# Patient Record
Sex: Female | Born: 1970 | Hispanic: Yes | Marital: Single | Smoking: Never smoker
Health system: Southern US, Community
[De-identification: ages and names within clinical notes are randomized; demographics above are authoritative.]

## PROBLEM LIST (undated history)

## (undated) DIAGNOSIS — F419 Anxiety disorder, unspecified: Secondary | ICD-10-CM

## (undated) HISTORY — PX: TUBAL LIGATION: SHX77

## (undated) HISTORY — DX: Anxiety disorder, unspecified: F41.9

---

## 2002-08-06 ENCOUNTER — Emergency Department (HOSPITAL_COMMUNITY): Admission: EM | Admit: 2002-08-06 | Discharge: 2002-08-06 | Payer: Self-pay | Admitting: Emergency Medicine

## 2002-08-06 ENCOUNTER — Encounter: Payer: Self-pay | Admitting: Emergency Medicine

## 2003-08-24 ENCOUNTER — Emergency Department (HOSPITAL_COMMUNITY): Admission: EM | Admit: 2003-08-24 | Discharge: 2003-08-24 | Payer: Self-pay | Admitting: Emergency Medicine

## 2009-05-15 ENCOUNTER — Encounter: Admission: RE | Admit: 2009-05-15 | Discharge: 2009-05-15 | Payer: Self-pay | Admitting: Internal Medicine

## 2012-03-02 ENCOUNTER — Ambulatory Visit: Payer: Self-pay | Admitting: Gynecology

## 2012-03-05 ENCOUNTER — Ambulatory Visit: Payer: Self-pay | Admitting: Gynecology

## 2012-03-11 ENCOUNTER — Other Ambulatory Visit: Payer: Self-pay | Admitting: Gynecology

## 2012-03-11 ENCOUNTER — Ambulatory Visit (INDEPENDENT_AMBULATORY_CARE_PROVIDER_SITE_OTHER): Payer: BC Managed Care – PPO

## 2012-03-11 ENCOUNTER — Ambulatory Visit (INDEPENDENT_AMBULATORY_CARE_PROVIDER_SITE_OTHER): Payer: BC Managed Care – PPO | Admitting: Gynecology

## 2012-03-11 ENCOUNTER — Encounter: Payer: Self-pay | Admitting: Gynecology

## 2012-03-11 VITALS — BP 138/90 | Ht 64.0 in | Wt 166.0 lb

## 2012-03-11 DIAGNOSIS — D251 Intramural leiomyoma of uterus: Secondary | ICD-10-CM

## 2012-03-11 DIAGNOSIS — R3915 Urgency of urination: Secondary | ICD-10-CM

## 2012-03-11 DIAGNOSIS — R102 Pelvic and perineal pain: Secondary | ICD-10-CM

## 2012-03-11 DIAGNOSIS — D259 Leiomyoma of uterus, unspecified: Secondary | ICD-10-CM

## 2012-03-11 DIAGNOSIS — N949 Unspecified condition associated with female genital organs and menstrual cycle: Secondary | ICD-10-CM

## 2012-03-11 DIAGNOSIS — N852 Hypertrophy of uterus: Secondary | ICD-10-CM

## 2012-03-11 DIAGNOSIS — I10 Essential (primary) hypertension: Secondary | ICD-10-CM | POA: Insufficient documentation

## 2012-03-11 DIAGNOSIS — N831 Corpus luteum cyst of ovary, unspecified side: Secondary | ICD-10-CM

## 2012-03-11 DIAGNOSIS — N839 Noninflammatory disorder of ovary, fallopian tube and broad ligament, unspecified: Secondary | ICD-10-CM

## 2012-03-11 DIAGNOSIS — N83209 Unspecified ovarian cyst, unspecified side: Secondary | ICD-10-CM

## 2012-03-11 MED ORDER — IBUPROFEN 800 MG PO TABS: 800.0000 mg | ORAL_TABLET | Freq: Three times a day (TID) | ORAL | Status: AC | PRN

## 2012-03-11 MED ORDER — MEDROXYPROGESTERONE ACETATE 150 MG/ML IM SUSP
150.0000 mg | Freq: Once | INTRAMUSCULAR | Status: AC
  Administered 2012-03-11: 150 mg via INTRAMUSCULAR

## 2012-03-11 NOTE — Patient Instructions (Signed)
Quiste ovrico (Ovarian Cyst) Los ovarios son pequeos rganos que se encuentran a cada lado del tero. Los ovarios son los rganos que producen las hormonas femeninas, estrgeno y Education officer, museum. Un quiste en el ovario es una bolsa llena de lquido que puede variar en tamao. Es normal que se formen pequeos quistes en las mujeres en edad de procrear y que an tienen sus perodos Designer, jewellery. Este tipo de quiste se denomina quiste folicular que se transforma en un quiste ovulatorio (quiste del cuerpo lteo) despus de producir los vulos. Si la mujer no queda embarazada, desaparece sin ninguna intervencin. Existen otros tipos de quistes de ovario que pueden causar problemas y necesitan ser tratados. El problema ms grave es que el quiste sea canceroso. Debe advertirse que en las mujeres menopusicas que presentan un quiste de ovario, existe un mayor riesgo de que ese quiste sea canceroso. Deben evaluarse muy rpida y [place], y IT sales professional. Esto es ms importante en las mujeres menopusicas debido al elevado porcentaje de cncer de ovario durante este perodo. CAUSAS Y TIPOS DE CNCER DE OVARIO:  QUISTE FUNCIONAL: El quiste de folculo o cuerpo lteo es un quiste funcional que aparece todos los meses durante la ovulacin, con el ciclo menstrual. Si la mujer no queda embarazada, desaparecen con el prximo ciclo menstrual. Generalmente los quistes funcionales no presentan sntomas.   ENDOMETRIOMA: este quiste aparece en la superficie del tejido del tero. Un quiste se forma en el interior o [employer]. Cada mes se desarrolla un poco ms debido a la sangre del perodo menstrual. Tambin se denomina "quiste de chocolate" debido a que est lleno de sangre que se vuelve color marrn. Este tipo de quiste causa dolor en la zona inferior del abdomen durante las relaciones sexuales y durante el perodo menstrual.   CISTADENOMA: Se desarrolla a partir de las clulas externas del ovario.  Generalmente no son cancerosos. Pueden llegar a ser de gran tamao y causar dolor en la zona baja del abdomen y [employer] [place] [employer] sexuales. Este tipo de quiste puede retorcerse e interrumpir el flujo de [place], lo que causa un dolor muy intenso. Tambin puede romperse y Horticulturist, commercial.   QUISTE DERMOIDE: generalmente este tipo de quiste aparece en ambos ovarios. Puede haber diferentes tipos de tejidos en el quiste. Por ejemplo tejidos de piel, dientes, pelos o cartlago. En general no dan sntomas, excepto que sean muy grandes. Los quistes dermoides rara vez son cancerosos.   OVARIO POLIQUSTICO: es una enfermedad rara relacionada con trastornos hormonales que produce muchos quistes pequeos en ambos ovarios. Estos quistes son similares a los quistes de folculo pero nunca producen vulos y se transforman en cuerpo lteo. Pueden causar aumento del [employer], infertilidad, acn, aumento del vello facial y corporal y falta de perodos menstruales o perodos anormales. Muchas mujeres que sufren este problema presentan diabetes tipo 2. La causa exacta de este problema es desconocida. Un ovario poliqustico rara vez es canceroso.   QUISTE OVRICO TECALUTESTICO Aparece cuando hay demasiada hormona (gonadotrofina corinica humana), la que sobreestimula al ovario para producir vulos. Se observan con frecuencia cuando el mdico estimula los ovarios para la fertilizacin in vitro (bebs de probeta).   QUISTE LUTENICO: Aparece durante el embarazo. En algunos casos raros, produce una obstruccin del canal de parto. Generalmente desaparece despus del parto.  SNTOMAS  Dolor o molestias en la pelvis.   Dolor durante las [employer].   Aumento de la inflamacin en el abdomen.   Perodos menstruales anormales.  Aumento del [employer] perodos [place].   Deja de menstruar y no est embarazada.  DIAGNSTICO El diagnstico puede realizarse durante:  Los exmenes plvicos anuales  o de rutina (frecuente).   Ecografas   Radiografas de la pelvis.   Tomografa computada   Resonancia magntica..   Anlisis de sangre.  TRATAMIENTO  El tratamiento slo consiste en que el mdico controle el quiste [place]uria, durante 2  3 meses. Muchos desaparecen espontnemente, especialmente los quistes funcionales.   Puede aspirarse (secarse) con Marella Bile larga observndolo en una ecografa, o por laparoscopa (insertando un tubo en la pelvis a travs de una pequea incisin).   El quiste puede extirparse con laparoscopa.   En algunos casos es necesario extirparlo a travs de una incisin en la zona inferior del abdomen.   El tratamiento hormonal se utiliza para Restaurant manager, fast food ciertos tipos de [place].   Las pldoras anticonceptivas pueden utilizarse para Restaurant manager, fast food otros tipos.  INSTRUCCIONES PARA EL CUIDADO DOMICILIARIO Siga las indicaciones del profesional con respecto a:  Medicamentos   Visitas de control para evaluar y Pharmacologist.   Puede ser necesario que tenga que volver o concertar una cita con otro profesional para descubrir la causa exacta del quiste, si su mdico no es Research scientist (physical sciences).   Realice un examen plvico y un [place] todos los aos, segn las indicaciones.   Informe al mdico si tubo un quiste de ovario en el pasado.  SOLICITE ATENCIN MDICA SI:  Los perodos se atrasan, son irregulares, le faltan o son dolorosos.   El dolor abdominal (en el vientre) o en la pelvis persisten.   El abdomen se agranda o se hincha.   Siente una opresin en la vejiga o tiene problemas para vaciarla completamente.   Tiene dolor durante las [employer].   Tiene la sensacin de hinchazn, presin o molestias en el abdomen.   Pierde peso sin razn aparente.   Siente un Engineer, maintenance (IT).   Est constipada.   Pierde el apetito.   Aparece acn.   Aumenta el vello facial y Personal assistant.   Lenora Boys de peso sin hacer modificaciones en su actividad  fsica y en su dieta habitual.   Sospecha que est embarazada.  SOLICITE ATENCIN MDICA DE INMEDIATO SI:  Siente dolor abdominal cada vez ms intenso.   Si tiene ganas de vomitar (nuseas).   Le sube repentinamente la fiebre.   Siente dolor abdominal al mover el intestino.   Sus perodos menstruales son ms abundantes que lo habitual.  Document Released: 05/28/2005 Document Revised: 08/07/2011 [facility] Patient Information 2012 [place], [place].  Fibromas (Fibroids) Le han diagnosticado un fibroma. Los fibromas son tumores (bultos) de tejido muscular blando que pueden aparecer en cualquiel lugar del organismo de [place]. Generalmente se desarrollan en el tero (matriz). El sntoma (problema) ms comn de los fibromas es la hemorragia. Con el tiempo puede causar anemia (poca cantidad de glbulos rojos). Otros sntomas incluyen sensaciones de opresin y dolor en la pelvis. El diagnstico (determinar cul es el problema) se realiza a travs del examen fsico. A veces se utilizan pruebas como el [place]. Este examen es de gran utilidad cuando los fibromas se localizan alrededor de los ovarios y tambin para Engineer, manufacturing tumores. TRATAMIENTO  La mayor parte de los fibromas no necesitan tratamiento mdico o quirrgico. A veces es necesario realizar una biopsia (tomar muestras de tejido) de las paredes del tero para Clinical cytogeneticist. Si se ha descartado cncer y slo hay una pequea cantidad de hemorragia, el problema debe  mantenerse bajo observacin.   Un tratamiento hormonal puede mejorar el problema.   Cuando se hace necesario implementar una ciruga, sta consistir en la extirpacin del fibroma. Despus de la extirpacin de fibromas, no ser posible tener un parto por va vaginal. Todo depende de la ubicacin y la extensin de la [place]. Cuando se produce Chartered loss adjuster en presencia de un fibroma, generalmente es normal.   El profesional que lo asiste ayudar a Chief Strategy Officer que tratamiento es  el mejor para usted.  INSTRUCCIONES PARA EL CUIDADO DOMICILIARIO  Utilice los medicamentos de venta libre o de prescripcin para Chief Technology Officer, el malestar o la [place], segn se lo indique el profesional que lo asiste. No utilice aspirina, ya que puede Metallurgist.   Si las menstruaciones (perodos) son muy abundantes, registre el nmero de apsitos o tampones que utiliza por mes. Lleve esta informacin al profesional que la asiste. Esto puede ayudar a Leisure centre manager tratamiento para usted.  SOLICITE ATENCIN MDICA DE INMEDIATO SI:  Siente dolor o calambres en la pelvis que no puede controlar con los medicamentos, o experimenta un repentino aumento del Engineer, mining.   La hemorragia plvica [employer] o durante el transcurso de las mismas.   Si se siente mareada o se desmaya.   Presenta dolor abdominal (en el vientre) que se hace cada vez ms intenso.  Document Released: 08/18/2005 Document Revised: 08/07/2011 [facility] Patient Information 2012 [place], [place].  Endometriosis (Endometriosis) La endometriosis es un trastorno que se produce cuando existen porciones de endometrio (el recubrimiento del tero) fuera de su ubicacin normal. Puede ocurrir en muchos lugares prximos al tero (matriz), pero generalmente se produce en los ovarios, en las trompas de [place], en la vagina (canal de parto) y en los intestinos que se encuentran cerca del tero. Debido a que el tero elimina (expulsa) su recubrimiento todos los meses (menstruacin), hay una Cabin crew en el que el tejido endometrial est ubicado. SNTOMAS Generalmente no se presentan sntomas (problemas); sin embargo, debido a que la sangre irrita los tejidos que normalmente no estn expuestos a ella, cuando se producen los sntomas, estos varan segn Immunologist al cual se haya desplazado el endometrio. Entre los sntomas se incluyen el dolor de espalda y el dolor abdominal (vientre). Los perodos pueden  ser ms abundantes y las relaciones sexuales dolorosas. Puede haber infertilidad. Usted podr sufrir todos estos sntomas al [employer] o no, o puede haber meses en los que no tenga ningn sntoma. Aunque los sntomas se producen principalmente durante las menstruaciones, tambin pueden aparecer en la mitad del ciclo y generalmente terminan con la menopausia. DIAGNSTICO El profesional que la asiste podr indicarle un examen de sangre y de [place] para descartar otros trastornos. Otra prueba frecuente en estos casos es el [place], un procedimiento indoloro que utiliza ondas de sonido para hacer una ecografa del tejido anormal que indica endometriosis. Si siente dolor al mover el intestino durante el perodo, el profesional que la asiste podr indicarle un enema de bario (radiografa de la parte inferior del intestino), para tratar de Contractor origen del dolor. A veces se confirma por medio de una laparoscopa. La laparoscopa es un procedimiento en el que el profesional observa el interior del abdomen con un laparoscopio (un pequeo telescopio con forma de lpiz). El profesional podr tomar una pequea muestra de tejido (biopsia) de los tejidos anormales para confirmar o Psychiatric nurse problema. Los tejidos se envan al laboratorio y un patlogo los observa bajo el  microscopio para dar un diagnstico. TRATAMIENTO Una vez que se realiza el diagnstico, puede tratarse con la destruccin del tejido endometrial desplazado, utilizando calor (diatermia), lser, escisin (corte), o por medios qumicos. Tambin puede tratarse con terapia hormonal. Cuando se utiliza la terapia hormonal, se eliminan las [place], por lo tanto se elimina la exposicin mensual a la sangre del tejido endometrial desplazado. Slo en los casos graves es necesario realizar una histerectoma con extirpacin de las trompas, el tero y los ovarios. INSTRUCCIONES PARA EL CUIDADO DOMICILIARIO  Utilice los medicamentos de venta libre  o de prescripcin para Chief Technology Officer, el malestar o la [place], segn se lo indique el profesional que lo asiste.   Evite las actividades que producen dolor, incluyendo las [employer].   No tome aspirinas ya que puede aumentar las hemorragias cuando no recibe terapia hormonal.   Consulte al profesional que la asiste si siente dolor o tiene problemas que no puede controlar con Scientist, research (medical).  SOLICITE ATENCIN MDICA DE INMEDIATO SI:  El dolor es intenso y no responde a Tourist information centre manager.   Comienza a sentir nuseas y vmitos o no puede [employer].   El dolor se localiza en la zona inferior derecha del abdomen (posible apendicitis).   Presenta hinchazn o aumento del dolor en el abdomen.   Tiene fiebre.   Observa sangre en la materia fecal.  EST SEGURO QUE:   Comprende las instrucciones para el alta mdica.   Controlar su enfermedad.   Solicitar atencin mdica de inmediato segn las indicaciones.  Document Released: 08/18/2005 Document Revised: 08/07/2011 [facility] Patient Information 2012 [place], [place].  Laparoscopa diagnstica (Diagnostic Laparoscopy) La laparoscopa es un procedimiento quirrgico relativamente simple, de uso habitual y breve (menos de una hora) que se lleva a cabo para diagnosticar y tratar enfermedades del abdomen. El laparoscopio (tubo delgado, que emite luz, del tamao de un lpiz y similar a un telescopio) se inserta en el abdomen a travs de una pequea incisin (corte realizado por un cirujano). A travs de este instrumento, el profesional podr observar [employer] rganos del interior del abdomen (vientre) y ver si hay algo anormal. La laparoscopa podr llevarse a cabo tanto en el hospital como en un consultorio. Podrn administrarle un sedante suave que lo ayudar a relajarse antes y durante el procedimiento. Una vez en la sala de operaciones, le administrarn una anestesia general (a menos que usted y el profesional elijan otro tipo  de anestesia). Despus de la laparoscopa, que generalmente dura menos de Georgianne Fick, ser [employer] sala de recuperacin durante algunas horas. Cuando regrese a Pensions consultant, la Arts administrator [employer] y [place]. RIESGOS Y COMPLICACIONES Comparados con los beneficios, los riesgos de la laparoscopia son relativamente pocos. El Animal nutritionist con usted los riesgos antes del procedimiento. Algunos problemas que pueden ocurrir luego de la intervencin son:  Infecciones.   Hemorragias.   Puede ocurrir que se lesionen otros rganos.   Efectos secundarios de Higher education careers adviser.  PROCEDIMIENTO Una vez que se encuentra anestesiado, el cirujano insufla el abdomen con un gas inofensivo (dixido de carbono) para Research officer, political party observacin de los rganos de la pelvis. El cirujano introduce el laparoscopio a travs de una pequea incisin en el ombligo o alrededor del mismo. Podr insertar otros instrumentos, como una sonda para mover los rganos o Education officer, environmental algn procedimiento a travs de otra pequea incisin.  En ocasiones se toma una biopsia (muestra de tejido) para un diagnstico ms preciso o para Arboriculturist  enfermedad. La biopsia consiste en tomar una pequea muestra de tejido durante la laparoscopia para enviarlo al patlogo (especialista en la observacin de clulas y muestras de tejido) y que lo examine en el microscopio para un diagnstico a nivel de los tejidos. DESPUES DEL PROCEDIMIENTO  Se libera el gas del abdomen.   Las incisiones se cierran con puntos (suturas). Debido a que las incisiones son pequeas (generalmente de menos de 1 cm) las molestias son mnimas luego del procedimiento. Es posible que sienta cierto Sales executive. Es consecuencia de Education officer, community del tubo mientras se encontraba dormido. Es posible que sienta algn dolor abdominal no muy intenso. Tambin podr sentir [employer] en las incisiones realizadas para insertar los instrumentos en el  abdomen.   El tiempo de recuperacin es reducido, siempre que no haya habido complicaciones.   Har reposo en la sala de recuperacin hasta que se encuentre estable y se sienta bien. Si no aparecen complicaciones, podr regresar a su casa.  AVERIGE LOS RESULTADOS DE SU ANLISIS Durante su visita no contar con todos los [employer]. En este caso, tenga otra entrevista con su mdico para conocerlos. No piense que el resultado es normal si no tiene noticias de su mdico o de la institucin mdica. Es Copy seguimiento de todos los [place]. INSTRUCCIONES PARA EL CUIDADO DOMICILIARIO  Tome todos los medicamentos tal como se le indic.   Utilice los medicamentos de venta libre o de prescripcin para Chief Technology Officer, Environmental health practitioner o la [place], segn se lo indique el profesional que lo asiste.   Reanude las actividades habituales cuando se le indique.   Es preferible que se duche y no tome baos de inmersin.   Reanude la actividad sexual luego de [place], o cuando lo autoricen.   No conduzca mientras se encuentre bajo los efectos de narcticos.  SOLICITE ATENCIN MDICA SI:  Siente un dolor abdominal inexplicable.   Siente dolor en los hombros, en la regin de los tirantes.   Si se siente aturdido o siente que se va a Artist.   Siente escalofros.   Usted o su nio tienen una temperatura oral de ms de 102 F (38.9 C).   Observa un drenaje purulento (similar al pus) que proviene de alguna de las heridas.   Usted o su hijo no puede realizar movimientos intestinales o evacuar gases.   Usted o su hijo sufren nuseas o vmitos.  EST SEGURO QUE:   Comprende las instrucciones para el alta mdica.   Controlar su enfermedad.   Solicitar atencin mdica de inmediato segn las indicaciones.  Document Released: 08/18/2005 Document Revised: 08/07/2011 [facility] Patient Information 2012 [place], [place].

## 2012-03-11 NOTE — Progress Notes (Signed)
Patient is a 41 year old gravida 3 para 2 AB 1 (normal spontaneous vaginal delivery) who presented to the office today with a two-month history of right lower quadrant pains. She states that it's occurring on a daily basis not related to any time of her menstrual cycle. She is having normal menstrual cycles every 28 days and last 5-7 days. Her primary physician has her on hypertensive medication and was recently on antibiotic for urinary tract infection. She states sometimes she has dyspareunia. She has no other GU or GI complaints. She does states that she douches vaginally with water and then or at least every day. She has had a previous tubal sterilization procedure. Her primary physician recently general her lab work and Pap smear and according to patient that were all normal. She did bring a note from his office and all labs were normal.  Exam: Abdomen: Soft tender on deep palpation in the right lower quadrant. Patient with positive obturator and psoas sign on the Right. No CVA Tenderness. Pelvic: Bartholin urethra Skene was within normal limits Vagina: No lesion or discharge Cervix: No lesions or discharge Uterus: Slightly anteverted nonenlarged Adnexa: Tenderness in right adnexa could not get it to feel of the ovary will await results of ultrasound. Left adnexa same. Rectal: Not done  Ultrasound: Uterus measured 10.6 x 5.8 x 5.1 cm with an endometrial stripe of 8.2 mm. Uterus is anteverted to several intramural myomas were noted the largest one measuring 17 x 17 mm. Right ovary had a thick wall follicle negative color flow measured 15 x 17 mm. Right ovary was located against the wall of the uterus. Left ovary thinwall cyst measuring 30 x 31 mm with reticular echo pattern negative color flow.  Assessment/plan: Hemorrhagic cyst versus endometrioma. Patient will receive Depo-Provera 150 mg IM today and will return back to the office in 3 months for followup ultrasound. I've given the patient  literature information in Spanish on laparoscopy as well as on endometriosis and ovarian cyst. Prescription for Motrin 800 mg to take 1 by mouth 3 times a day was provided. All questions were answered we'll follow accordingly.

## 2012-03-12 LAB — URINALYSIS W MICROSCOPIC + REFLEX CULTURE
Leukocytes, UA: NEGATIVE
Nitrite: NEGATIVE
Protein, ur: NEGATIVE mg/dL
Squamous Epithelial / LPF: NONE SEEN
Urobilinogen, UA: 0.2 mg/dL (ref 0.0–1.0)

## 2012-06-18 ENCOUNTER — Ambulatory Visit (INDEPENDENT_AMBULATORY_CARE_PROVIDER_SITE_OTHER): Payer: BC Managed Care – PPO | Admitting: Gynecology

## 2012-06-18 ENCOUNTER — Encounter: Payer: Self-pay | Admitting: Gynecology

## 2012-06-18 ENCOUNTER — Ambulatory Visit (INDEPENDENT_AMBULATORY_CARE_PROVIDER_SITE_OTHER): Payer: BC Managed Care – PPO

## 2012-06-18 VITALS — BP 128/86

## 2012-06-18 DIAGNOSIS — R102 Pelvic and perineal pain: Secondary | ICD-10-CM

## 2012-06-18 DIAGNOSIS — N938 Other specified abnormal uterine and vaginal bleeding: Secondary | ICD-10-CM

## 2012-06-18 DIAGNOSIS — D219 Benign neoplasm of connective and other soft tissue, unspecified: Secondary | ICD-10-CM

## 2012-06-18 DIAGNOSIS — N83209 Unspecified ovarian cyst, unspecified side: Secondary | ICD-10-CM

## 2012-06-18 DIAGNOSIS — D259 Leiomyoma of uterus, unspecified: Secondary | ICD-10-CM

## 2012-06-18 DIAGNOSIS — N949 Unspecified condition associated with female genital organs and menstrual cycle: Secondary | ICD-10-CM

## 2012-06-18 LAB — TSH: TSH: 0.718 u[IU]/mL (ref 0.350–4.500)

## 2012-06-18 NOTE — Progress Notes (Signed)
The patient is a 41 year old who was seen the office on July 11 gravida 3 para 2 AB 1 (normal spontaneous vaginal delivery) who presented complaining of a two-month history of right lower quadrant pains. She states that it was occurring on a daily basis not related to any time of her menstrual cycle. Patient has had a previous tubal sterilization procedure. She had no other GU or GI complaints. She had stated that she did vaginal douching with water at least every other day and was instructed not to do so. An ultrasound at last office visit demonstrated the following:  Ultrasound: Uterus measured 10.6 x 5.8 x 5.1 cm with an endometrial stripe of 8.2 mm. Uterus is anteverted to several intramural myomas were noted the largest one measuring 17 x 17 mm. Right ovary had a thick wall follicle negative color flow measured 15 x 17 mm. Right ovary was located against the wall of the uterus. Left ovary thinwall cyst measuring 30 x 31 mm with reticular echo pattern negative color flow.   The working diagnosis was a hemorrhagic cyst versus endometrioma and she received Depo-Provera 150 mg IM and was instructed to return today for followup ultrasound. She states that her symptom has resolved that she has been spotting for 3 weeks before the start of her period.  She underwent a sonohysterogram today with the following result: Uterus measured 9.7 x 6.2 x 5.2 cm with endometrial stripe of 3.1 mm. Patient with 5 small fibroids the largest one measuring 18 x 12 mm. Both ovaries appeared to be normal. Sonohysterogram no intracavitary defect.  Patient was counseled in a sterile fashion with a P[place]elle an endometrial biopsy was obtained and tissue was submitted for histological evaluation. A TSH and prolactin will be drawn today. Patient will maintain a menstrual calendar for the next 4 months and return to the office in February for her annual exam and we'll reassess at that point if any further intervention is needed. Will  notify her when the results of the pathology report become available.

## 2012-10-14 ENCOUNTER — Encounter: Payer: BC Managed Care – PPO | Admitting: Gynecology

## 2012-10-21 ENCOUNTER — Ambulatory Visit (INDEPENDENT_AMBULATORY_CARE_PROVIDER_SITE_OTHER): Payer: BC Managed Care – PPO | Admitting: Gynecology

## 2012-10-21 ENCOUNTER — Encounter: Payer: Self-pay | Admitting: Gynecology

## 2012-10-21 VITALS — BP 150/90 | Ht 64.5 in | Wt 182.0 lb

## 2012-10-21 DIAGNOSIS — D259 Leiomyoma of uterus, unspecified: Secondary | ICD-10-CM | POA: Insufficient documentation

## 2012-10-21 DIAGNOSIS — R51 Headache: Secondary | ICD-10-CM

## 2012-10-21 DIAGNOSIS — R635 Abnormal weight gain: Secondary | ICD-10-CM

## 2012-10-21 DIAGNOSIS — N951 Menopausal and female climacteric states: Secondary | ICD-10-CM

## 2012-10-21 DIAGNOSIS — Z01419 Encounter for gynecological examination (general) (routine) without abnormal findings: Secondary | ICD-10-CM

## 2012-10-21 DIAGNOSIS — N912 Amenorrhea, unspecified: Secondary | ICD-10-CM

## 2012-10-21 DIAGNOSIS — R232 Flushing: Secondary | ICD-10-CM | POA: Insufficient documentation

## 2012-10-21 LAB — PREGNANCY, URINE: Preg Test, Ur: NEGATIVE

## 2012-10-21 LAB — PROLACTIN: Prolactin: 16 ng/mL

## 2012-10-21 LAB — HEMOGLOBIN A1C
Hgb A1c MFr Bld: 5.6 % (ref <5.7)
Mean Plasma Glucose: 114 mg/dL (ref <117)

## 2012-10-21 LAB — THYROID PANEL WITH TSH
T3 Uptake: 31.1 % (ref 22.5–37.0)
T4, Total: 8.8 ug/dL (ref 5.0–12.5)

## 2012-10-21 LAB — FOLLICLE STIMULATING HORMONE: FSH: 5.2 m[IU]/mL

## 2012-10-21 NOTE — Progress Notes (Signed)
Mia Robinson 1971/04/08 [id number]   History:    42 y.o.  for annual gyn exam also stated she had not had a menstrual cycle since last year. She stated her last normal menstrual cycle was in November. She stated in November was very light in December almost hardly any bleeding. She denied any menses in January and thus far in February no bleeding whatsoever. Patient complaining of weight gain some headaches and some hot flashes but no nipple discharge. She has had a previous tubal sterilization procedure. Her primary physician Dr. Ludwig Clarks has been treating her for hypertension and doing her lab work. Patient with no prior history of abnormal Pap smears. Patient was seen in the office in October 2013 having complaint of right lower quadrant discomfort and underwent an ultrasound with the following findings:  Ultrasound: (July 2013)Uterus measured 10.6 x 5.8 x 5.1 cm with an endometrial stripe of 8.2 mm. Uterus is anteverted to several intramural myomas were noted the largest one measuring 17 x 17 mm. Right ovary had a thick wall follicle negative color flow measured 15 x 17 mm. Right ovary was located against the wall of the uterus. Left ovary thinwall cyst measuring 30 x 31 mm with reticular echo pattern negative color flow.   The working diagnosis was a hemorrhagic cyst versus endometrioma and she received Depo-Provera 150 mg IM and was instructed to return today for followup ultrasound. She states that her symptom has resolved that she has been spotting for 3 weeks before the start of her period  Her followup ultrasound in October 2013 She underwent a sonohysterogram today with the following result:  Uterus measured 9.7 x 6.2 x 5.2 cm with endometrial stripe of 3.1 mm. Patient with 5 small fibroids the largest one measuring 18 x 12 mm. Both ovaries appeared to be normal. Sonohysterogram no intracavitary defect   Past medical history,surgical history, family history and social history were  all reviewed and documented in the EPIC chart.  Gynecologic History Patient's last menstrual period was 07/21/2012. Contraception: tubal ligation Last Pap: 2013. Results were: normal Last mammogram:  Not indicated. Results were: not indicated  Obstetric History OB History   Grav Para Term Preterm Abortions TAB SAB Ect Mult Living   3 2 2  1  1   2      # Outc Date GA Lbr Len/2nd Wgt Sex Del Anes PTL Lv   1 TRM     F SVD  No Yes   2 SAB            3 TRM     F SVD  No Yes       ROS: A ROS was performed and pertinent positives and negatives are included in the history.  GENERAL: No fevers or chills. HEENT: No change in vision, no earache, sore throat or sinus congestion. NECK: No pain or stiffness. CARDIOVASCULAR: No chest pain or pressure. No palpitations. PULMONARY: No shortness of breath, cough or wheeze. GASTROINTESTINAL: No abdominal pain, nausea, vomiting or diarrhea, melena or bright red blood per rectum. GENITOURINARY: No urinary frequency, urgency, hesitancy or dysuria. MUSCULOSKELETAL: No joint or muscle pain, no back pain, no recent trauma. DERMATOLOGIC: No rash, no itching, no lesions. ENDOCRINE: No polyuria, polydipsia, no heat or cold intolerance. No recent change in weight. HEMATOLOGICAL: No anemia or easy bruising or bleeding. NEUROLOGIC: No headache, seizures, numbness, tingling or weakness. PSYCHIATRIC: No depression, no loss of interest in normal activity or change in sleep pattern.     Exam:  chaperone present  BP 150/90  Ht 5' 4.5" (1.638 m)  Wt 182 lb (82.555 kg)  BMI 30.77 kg/m2  LMP 07/21/2012  Body mass index is 30.77 kg/(m^2).  General appearance : Well developed well nourished female. No acute distress HEENT: Neck supple, trachea midline, no carotid bruits, no thyroidmegaly Lungs: Clear to auscultation, no rhonchi or wheezes, or rib retractions  Heart: Regular rate and rhythm, no murmurs or gallops Breast:Examined in sitting and supine position were  symmetrical in appearance, no palpable masses or tenderness,  no skin retraction, no nipple inversion, no nipple discharge, no skin discoloration, no axillary or supraclavicular lymphadenopathy Abdomen: no palpable masses or tenderness, no rebound or guarding Extremities: no edema or skin discoloration or tenderness  Pelvic:  Bartholin, Urethra, Skene Glands: Within normal limits             Vagina: No gross lesions or discharge  Cervix: No gross lesions or discharge  Uterus  anteverted, normal size, shape and consistency, non-tender and mobile  Adnexa  Without masses or tenderness  Anus and perineum  normal   Rectovaginal  normal sphincter tone without palpated masses or tenderness             Hemoccult not indicated   Tenderness in lower abdomen right greater than left but no rebound or guarding  Assessment/Plan:  42 y.o. female for annual exam with apparent oligomenorrhea and symptoms of hot flashes. We'll check an [facility] today along with a TSH hemoglobin A1c and prolactin level. We will check a urine pregnancy test. She'll return back next week to discuss the result. Followup ultrasound October 2013 (4 months ago) completely normal with exception of small fibroids. No Pap smear done today the new guidelines discussed. Patient will need a mammogram next year.    Ok Edwards MD, 4:19 PM 10/21/2012

## 2012-10-28 ENCOUNTER — Institutional Professional Consult (permissible substitution): Payer: BC Managed Care – PPO | Admitting: Gynecology

## 2012-11-03 ENCOUNTER — Telehealth: Payer: Self-pay | Admitting: *Deleted

## 2012-11-03 NOTE — Telephone Encounter (Signed)
Left message for pt to call.

## 2012-11-03 NOTE — Telephone Encounter (Signed)
Pt informed with the below note. 

## 2012-11-03 NOTE — Telephone Encounter (Signed)
Pt had a OV to discuss labs results on 10/28/12 but pt canceled appointment. She is requesting the information that you were going to tell her at OV regarding lab results. Please advise

## 2012-11-03 NOTE — Telephone Encounter (Signed)
Tell patient her recent lab results were normal. We were going to discuss treatment for her oligomenorrhea.

## 2013-01-05 ENCOUNTER — Telehealth: Payer: Self-pay | Admitting: *Deleted

## 2013-01-05 MED ORDER — IBUPROFEN 800 MG PO TABS: 800.0000 mg | ORAL_TABLET | Freq: Three times a day (TID) | ORAL | Status: DC | PRN

## 2013-01-05 NOTE — Telephone Encounter (Signed)
Pt is calling requesting refill on Ibuprofen 800 mg to help with fibroids. Please advise

## 2013-01-05 NOTE — Telephone Encounter (Signed)
rx sent

## 2013-01-05 NOTE — Telephone Encounter (Signed)
Motrin 800 mg TID prn pain # 30 (with foods) refil x 1

## 2013-09-01 HISTORY — PX: BREAST BIOPSY: SHX20

## 2013-10-28 ENCOUNTER — Encounter: Payer: Self-pay | Admitting: Gynecology

## 2013-11-25 ENCOUNTER — Encounter: Payer: Self-pay | Admitting: Gynecology

## 2013-11-25 ENCOUNTER — Other Ambulatory Visit (HOSPITAL_COMMUNITY)
Admission: RE | Admit: 2013-11-25 | Discharge: 2013-11-25 | Disposition: A | Discharge status: Home / Self Care | Payer: BC Managed Care – PPO | Source: Ambulatory Visit | Attending: Gynecology | Admitting: Gynecology

## 2013-11-25 ENCOUNTER — Ambulatory Visit (INDEPENDENT_AMBULATORY_CARE_PROVIDER_SITE_OTHER): Payer: BC Managed Care – PPO

## 2013-11-25 ENCOUNTER — Ambulatory Visit (INDEPENDENT_AMBULATORY_CARE_PROVIDER_SITE_OTHER): Payer: BC Managed Care – PPO | Admitting: Gynecology

## 2013-11-25 VITALS — BP 134/86 | Ht 65.0 in | Wt 175.0 lb

## 2013-11-25 DIAGNOSIS — Z1151 Encounter for screening for human papillomavirus (HPV): Secondary | ICD-10-CM | POA: Insufficient documentation

## 2013-11-25 DIAGNOSIS — Z01419 Encounter for gynecological examination (general) (routine) without abnormal findings: Secondary | ICD-10-CM

## 2013-11-25 DIAGNOSIS — D259 Leiomyoma of uterus, unspecified: Secondary | ICD-10-CM

## 2013-11-25 DIAGNOSIS — N920 Excessive and frequent menstruation with regular cycle: Secondary | ICD-10-CM

## 2013-11-25 DIAGNOSIS — N898 Other specified noninflammatory disorders of vagina: Secondary | ICD-10-CM

## 2013-11-25 LAB — WET PREP FOR TRICH, YEAST, CLUE
Clue Cells Wet Prep HPF POC: NONE SEEN
Trich, Wet Prep: NONE SEEN
YEAST WET PREP: NONE SEEN

## 2013-11-25 MED ORDER — TRANEXAMIC ACID 650 MG PO TABS: ORAL_TABLET | ORAL | Status: DC

## 2013-11-25 NOTE — Patient Instructions (Addendum)
Ecografa transvaginal (Transvaginal Ultrasound) La ecografa transvaginal es una ecografa plvica en la que se utiliza una probeta metlica que se coloca en la vagina, para observar los rganos femeninos. El ecgrafo enva ondas sonoras desde un transductor (sonda). Estas ondas sonoras chocan contra las estructuras del cuerpo (como un eco) y crean Proofreader. La imagen se observa en un monitor. Se denomina transvaginal debido a que la sonda se inserta dentro de la vagina. Puede haber una pequea molestia por la introduccin de la sonda. Esta prueba tambin puede realizarse [employer]. La ecografa endovaginal es otro nombre para la ecografa transvaginal. En una ecografa transabdominal, la sonda se coloca en la parte externa del abdomen. Este mtodo no ofrece imgenes tan buenas como la tcnica transvaginal. La ecogafa transvaginal se utiliza para observar alteraciones en el tracto genital femenino. Entre ellos se incluyen:  Problemas de infertilidad.  Malformaciones congnitas (defecto de nacimiento) del tero y los ovarios.  Tumores en el tero.  Hemorragias anormales.  Tumores y quistes de ovario.  Abscesos (tejidos inflamados y pus) en la pelvis.  Dolor abdominal o plvico sin causa aparente.  Infecciones plvicas. DURANTE EL EMBARAZO, SE UTILIZA PAR OBSERVAR:  Embarazos normales.  Un embarazo ectpico (embarazo fuera del tero).  Latidos cardacos fetales.  Anormalidades de la pelvis que no se observan bien con la ecografa transabdominal.  Sospecha de gemelos o embarazo mltiple.  Aborto inminente.  Problemas en el cuello del tero (cuello incompetente, no permanece cerrado para contener al beb).  Cuando se realiza una amniocentesis (se retira lquido de la bolsa [place], para ser [place]).  Al buscar anormalidades en el beb.  Para controlar el crecimiento, el desarrollo y la edad del feto.  Para medir la cantidad de lquido en el saco  amnitico.  Cuando se realiza una versin externa del beb (se lo mueve a Programmer, applications).  Evaluar al beb en embarazos de alto riesgo (perfil biofsico).  Si se sospecha el deceso del beb (muerte). En algunos casos, se utiliza un mtodo especial denominado ecografa con infusin salina, para una observacin ms precisa del tero. Se inyecta solucin salina (agua con sal) dentro del tero en pacientes no embarazadas para observar mejor su interior. Este mtodo no se Designer, fashion/clothing. La probeta tambin puede usarse para obtener biopsias de Educational psychologist, para drenar lquido de quistes de ovario y para Designer, jewellery un DIU (dispositivo intrauterino para el control de la natalidad) que no pueda [place]. PREPARACIN PARA LA PRUEBA La ecografa transvaginal se realiza con la vejiga vaca. La ecografa transabdominal se realiza con la vejiga llena. Podrn solicitarle que beba varios vasos de agua antes del examen. En algunos casos se realiza una ecografa transabdominal antes de la ecografa transvaginal para obervar los rganos del abdomen. PROCEDIMIENTO  Deber acostarse en una cama, con las rodillas dobladas y los pies en los estribos. La probeta se cubre con un condn. Dentro de la vagina y en la probeta se aplica un lubricante estril. El lubricante ayuda a transmitir las ondas sonoras y Fish farm manager la irritacin de la vagina. El mdico mover la sonda en el interior de la cavidad vaginal para escanear las estructuras plvicas. Un examen normal mostrar una pelvis normal y contenidos normales en su interior. Una prueba anormal mostrar anormalidades en la pelvis, la placenta o el beb. LAS CAUSAS DE UN RESULTADO ANORMAL PUEDEN SER:  Crecimientos o tumores en:  El tero.  Los ovarios.  La vagina.  Otras estructuras plvicas.  Crecimientos no cancerosos  en el tero y los ovarios.  El ovario se retuerce y se corta el suministro de [place]).  Las reas de  infeccin incluyen:  Enfermedad inflamatoria plvica.  Un absceso en la pelvis.  Ubicacin de un DIU. LOS PROBLEMAS QUE PUEDEN HALLARSE EN UNA MUJER EMBARAZADA SON:  Embarazo ectpico (embarazo fuera del tero).  Embarazos mltiples.  Dilatacin (apertura) precoz anormal del cuello del tero. Esto puede indicar un cuello incompetente y Biomedical scientist.  Aborto inminente.  Muerte fetal.  Los problemas con la placenta incluyen:  La placenta se ha desarrollado sobre la abertura del cuello del tero (placenta previa).  La placenta se ha separado anticipadamente en el tero (abrupcin placentaria).  La placenta se desarrolla en el msculo del tero (placenta acreta).  Tumores del Media planner, incluyendo la enfermedad trofoblstica gestacional. Se trata de un embarazo anormal en el que no hay feto. El tero se llena de quistes similares a uvas que en algunos casos son cancerosos.  Posicin incorrecta del feto (de nalgas, de vrtice).  Retraso del desarrollo fetal intrauterino (escaso desarrollo en el tero).  Anormalidades o infeccin fetal. RIESGOS Y COMPLICACIONES No hay riesgos conocidos para la ecografa. No se toman radiografas cuando se realiza una ecografa. Document Released: 12/04/2008 Document Revised: 11/10/2011 [facility] Patient Information 2014 [place], [place]. Fibromas (Fibroids) Los fibromas son bultos (tumores) que pueden Administrator del cuerpo de Musician. Estos tumores no son cancerosos. Pueden variar en tamao, peso y lugar en el que crecen. CUIDADOS EN EL HOGAR  No tome aspirina.  Anote el nmero de apsitos o tampones que [place] durante el perodo. Infrmelo a su mdico. Esto puede ayudar a determinar el mejor tratamiento para usted. SOLICITE AYUDA DE INMEDIATO SI:  Siente dolor en la zona inferior del vientre (abdomen) y no se alivia con analgsicos.  Tiene clicos que no se calman con medicamentos  Aumenta el sangrado entre perodos o  durante el mismo.  Sufre mareos o se desvanece (se desmaya).  El dolor en el vientre [place]. ASEGRESE DE QUE:  Comprende estas instrucciones.  Controlar su enfermedad.  Solicitar ayuda de inmediato si no mejora o empeora. Document Released: 12/03/2010 Document Revised: 11/10/2011 [facility] Patient Information 2014 [place], [place]. Tranexamic acid oral tablets Qu es este medicamento? El CIDO [employer] reduce o detiene el proceso mediante el cual los cogulos sanguneos se descomponen. Este medicamento se [place] para tratar los [employer]. Este medicamento puede ser utilizado para otros usos; si tiene alguna pregunta consulte con su proveedor de atencin mdica o con su farmacutico. MARCAS COMERCIALES DISPONIBLES: [place], Lysteda  Qu le debo informar a mi profesional de la salud antes de tomar este medicamento? Necesita saber si usted presenta alguno de los siguientes problemas o situaciones: -sangrado en el cerebro -problemas de coagulacin -enfermedad renal -problemas de visin  -una reaccin alrgica o inusual al cido tranexmico, a otros medicamentos, alimentos, colorantes o conservantes -si est embarazada o buscando quedar embarazada -si est amamantando a un beb Cmo debo utilizar este medicamento? Tome este medicamento por va oral con un vaso de agua. Siga las instrucciones de la etiqueta del [place]. No corte, triture ni mstique este medicamento. Puede tomarlo con o sin alimentos. Si le produce malestar estomacal, tmelo con alimentos. Tome sus dosis a intervalos regulares. No tome su medicamento con una frecuencia mayor a la indicada. No deje de tomarlo excepto si as lo indica su mdico. No tome este medicamento hasta que empieza su perodo. No lo tome durante  ms de 5 das en seguidos. No tome este medicamento mientras no tiene su perodo. Hable con su pediatra para informarse acerca del uso de este medicamento en nios. Aunque  este medicamento ha sido recetado a nias tan menores como de 12 aos de edad para condiciones selectivas, las precauciones se aplican. Sobredosis: Pngase en contacto inmediatamente con un centro toxicolgico o una sala de urgencia si usted cree que haya tomado demasiado medicamento. ATENCIN: [employer] es solo para usted. No comparta este medicamento con nadie. Qu sucede si me olvido de una dosis? Si olvida una dosis, tmela cuando se recuerde y tome la prxima dosis por lo menos 6 horas despus. No tome ms de 2 tabletas a la vez para compensar las dosis olvidadas. Qu puede interactuar con este medicamento? No tome esta medicina con ninguno de los siguientes medicamentos: -hormonas femeninas, como estrgenos o progestinas y pldoras, parches, anillos o inyecciones anticonceptivas  Esta medicina tambin puede Counselling psychologist con los siguientes medicamentos: -ciertos medicamentos usados para ayudar a que la sangre coagule o romper los cogulos de sangre -ciertos medicamentos usados para tratar la leucemia Puede ser que esta lista no menciona todas las posibles interacciones. Informe a su profesional de [employer]	[place] de [employer] productos a base de hierbas, medicamentos de [place] o suplementos nutritivos que est tomando. Si usted fuma, consume bebidas alcohlicas o si utiliza drogas ilegales, indqueselo tambin a su profesional de KB Home	[place]. Algunas sustancias pueden interactuar con su medicamento. A qu debo estar atento al usar Coca-Cola? Informe a su mdico o su profesional de la salud si sus sntomas no comienzan a mejorar o si empeoran. Informe a su mdico o su profesional de la salud si observa cualquier problema con los ojos mientras recibe Coca-Cola. Su mdico le referir a Optician, dispensing de los ojos que [employer] ojos. Qu efectos secundarios puedo tener al [employer] Corporation este medicamento? Efectos secundarios que debe informar a su mdico o a Barrister's clerk de la salud  tan pronto como sea posible: -Chief of Staff como erupcin cutnea, picazn o urticarias, hinchazn de la cara, labios o lengua -problemas respiratorios -cambios en la visin -dolor repentino o grave en el pecho, piernas, cabeza o ingle -cansancio o debilidad inusual  Efectos secundarios que, por lo general, no requieren atencin mdica (debe informarlos a su mdico o a su profesional de la salud si persisten o si son molestos): -dolor de espalda -dolor de cabeza -dolores musculares o articulares -problemas nasales o sinusales -dolor estomacal -cansancio Puede ser que esta lista no menciona todos los posibles efectos secundarios. Comunquese a su mdico por asesoramiento mdico Hum[employer] Inc. Usted puede informar los efectos secundarios a la FDA por telfono al 1-800-FDA-1088. Dnde debo guardar mi medicina? [place] fuera del alcance de los nios. Gurdela a [employer], entre 15 y 40 grados C (81 y 82 grados F). Deseche todo el medicamento que no haya utilizado, despus de la fecha de vencimiento. ATENCIN: Este folleto es un resumen. Puede ser que no cubra toda la posible informacin. Si usted tiene preguntas acerca de esta medicina, consulte con su mdico, su farmacutico o su profesional de Technical sales engineer.  2014, Elsevier/Gold Standard. (2012-06-16 15:07:37)

## 2013-11-25 NOTE — Progress Notes (Signed)
Mia Robinson 10/20/1970 [id number]   History:    43 y.o.  for annual gyn exam and thought she may have had a vaginal discharge. The way she describes this as she experience is slight fluid discharge before the start of her menses and she was reassured. Patient is in a monogamous relationship. Patient with prior history of fibroid uterus. Patient is having normal menstrual cycles although heavy for the first 2 or 3 days.Her primary physician Dr. Mellody Drown has been treating her for hypertension and doing her lab work. Patient with no prior history of abnormal Pap smears. Patient with prior tubal sterilization procedure. Patient has not had a baseline mammogram as of yet.  Patient's ultrasound in October 2013: Uterus measured 9.7 x 6.2 x 5.2 cm with endometrial stripe of 3.1 mm. Patient with 5 small fibroids the largest one measuring 18 x 12 mm. Both ovaries appeared to be normal. Sonohysterogram no intracavitary defect    Past medical history,surgical history, family history and social history were all reviewed and documented in the EPIC chart.  Gynecologic History Patient's last menstrual period was 10/31/2013. Contraception: tubal ligation Last Pap: 2014. Results were: normal Last mammogram: No prior study. Results were: No prior study  Obstetric History OB History  Gravida Para Term Preterm AB SAB TAB Ectopic Multiple Living  3 2 2  1 1    2     # Outcome Date GA Lbr Len/2nd Weight Sex Delivery Anes PTL Lv  3 TRM     F SVD  N Y  2 SAB           1 TRM     F SVD  N Y       ROS: A ROS was performed and pertinent positives and negatives are included in the history.  GENERAL: No fevers or chills. HEENT: No change in vision, no earache, sore throat or sinus congestion. NECK: No pain or stiffness. CARDIOVASCULAR: No chest pain or pressure. No palpitations. PULMONARY: No shortness of breath, cough or wheeze. GASTROINTESTINAL: No abdominal pain, nausea, vomiting or diarrhea,  melena or bright red blood per rectum. GENITOURINARY: No urinary frequency, urgency, hesitancy or dysuria. MUSCULOSKELETAL: No joint or muscle pain, no back pain, no recent trauma. DERMATOLOGIC: No rash, no itching, no lesions. ENDOCRINE: No polyuria, polydipsia, no heat or cold intolerance. No recent change in weight. HEMATOLOGICAL: No anemia or easy bruising or bleeding. NEUROLOGIC: No headache, seizures, numbness, tingling or weakness. PSYCHIATRIC: No depression, no loss of interest in normal activity or change in sleep pattern.     Exam: chaperone present  BP 134/86  Ht 5\' 5"  (1.651 m)  Wt 175 lb (79.379 kg)  BMI 29.12 kg/m2  LMP 10/31/2013  Body mass index is 29.12 kg/(m^2).  General appearance : Well developed well nourished female. No acute distress HEENT: Neck supple, trachea midline, no carotid bruits, no thyroidmegaly Lungs: Clear to auscultation, no rhonchi or wheezes, or rib retractions  Heart: Regular rate and rhythm, no murmurs or gallops Breast:Examined in sitting and supine position were symmetrical in appearance, no palpable masses or tenderness,  no skin retraction, no nipple inversion, no nipple discharge, no skin discoloration, no axillary or supraclavicular lymphadenopathy Abdomen: no palpable masses or tenderness, no rebound or guarding Extremities: no edema or skin discoloration or tenderness  Pelvic:  Bartholin, Urethra, Skene Glands: Within normal limits             Vagina: No gross lesions or discharge  Cervix: No gross lesions or  discharge  Uterus  8-1/2 week size slightly irregular, normal size, shape and consistency, non-tender and mobile  Adnexa  Without masses or tenderness  Anus and perineum  normal   Rectovaginal  normal sphincter tone without palpated masses or tenderness             Hemoccult not indicated   Ultrasound today demonstrated a uterus that measured 9.6 x 5.6 x 5.2 cm endometrial stripe 9.4 mm (patient on day 19 of her cycle). Patient with  5 fibroids the largest one measuring 13 x 9 mm. Ovaries are normal she had a small corpus luteum cyst on the right ovary measuring 24 x 17 mm. There was no free fluid in the cul-de-sac.  Assessment/Plan:  43 y.o. female for annual exam doing well with stable size of her fibroid uterus. For her menorrhagia she is going to be prescribed Lysteda 650 mg 2 tablets 3 times a day for 5 days during her menses. Her PCP will be drawn her blood work. Her wet prep was negative she was reassured. Pap smear was not done today in accordance to the new guidelines. I positioned to schedule her mammogram was provided. She was reminded to do her monthly breast exam. Also discussed importance of calcium vitamin D and regular exercise for osteoporosis prevention.  Note: This dictation was prepared with  Dragon/digital dictation along withSmart phrase technology. Any transcriptional errors that result from this process are unintentional.   Terrance Mass MD, 5:17 PM 11/25/2013

## 2014-03-08 ENCOUNTER — Other Ambulatory Visit: Payer: Self-pay

## 2014-03-08 DIAGNOSIS — Z1231 Encounter for screening mammogram for malignant neoplasm of breast: Secondary | ICD-10-CM

## 2014-03-13 ENCOUNTER — Ambulatory Visit
Admission: RE | Admit: 2014-03-13 | Discharge: 2014-03-13 | Disposition: A | Discharge status: Home / Self Care | Payer: BC Managed Care – PPO | Source: Ambulatory Visit

## 2014-03-13 ENCOUNTER — Encounter (INDEPENDENT_AMBULATORY_CARE_PROVIDER_SITE_OTHER): Payer: Self-pay

## 2014-03-13 DIAGNOSIS — Z1231 Encounter for screening mammogram for malignant neoplasm of breast: Secondary | ICD-10-CM

## 2014-03-14 ENCOUNTER — Other Ambulatory Visit: Payer: Self-pay | Admitting: Gynecology

## 2014-03-14 DIAGNOSIS — R928 Other abnormal and inconclusive findings on diagnostic imaging of breast: Secondary | ICD-10-CM

## 2014-03-24 ENCOUNTER — Other Ambulatory Visit: Payer: BC Managed Care – PPO

## 2014-03-30 ENCOUNTER — Ambulatory Visit
Admission: RE | Admit: 2014-03-30 | Discharge: 2014-03-30 | Disposition: A | Discharge status: Home / Self Care | Payer: BC Managed Care – PPO | Source: Ambulatory Visit | Attending: Gynecology | Admitting: Gynecology

## 2014-03-30 ENCOUNTER — Other Ambulatory Visit: Payer: Self-pay | Admitting: Gynecology

## 2014-03-30 DIAGNOSIS — R928 Other abnormal and inconclusive findings on diagnostic imaging of breast: Secondary | ICD-10-CM

## 2014-03-30 DIAGNOSIS — R921 Mammographic calcification found on diagnostic imaging of breast: Secondary | ICD-10-CM

## 2014-04-10 ENCOUNTER — Ambulatory Visit
Admission: RE | Admit: 2014-04-10 | Discharge: 2014-04-10 | Disposition: A | Discharge status: Home / Self Care | Payer: BC Managed Care – PPO | Source: Ambulatory Visit | Attending: Gynecology | Admitting: Gynecology

## 2014-04-10 DIAGNOSIS — R921 Mammographic calcification found on diagnostic imaging of breast: Secondary | ICD-10-CM

## 2014-07-03 ENCOUNTER — Encounter: Payer: Self-pay | Admitting: Gynecology

## 2015-03-31 ENCOUNTER — Encounter (HOSPITAL_COMMUNITY): Payer: Self-pay

## 2015-03-31 ENCOUNTER — Emergency Department (HOSPITAL_COMMUNITY)
Admission: EM | Admit: 2015-03-31 | Discharge: 2015-03-31 | Disposition: A | Discharge status: Home / Self Care | Payer: BC Managed Care – PPO | Attending: Emergency Medicine | Admitting: Emergency Medicine

## 2015-03-31 DIAGNOSIS — Y9389 Activity, other specified: Secondary | ICD-10-CM | POA: Diagnosis not present

## 2015-03-31 DIAGNOSIS — Z8659 Personal history of other mental and behavioral disorders: Secondary | ICD-10-CM | POA: Diagnosis not present

## 2015-03-31 DIAGNOSIS — I10 Essential (primary) hypertension: Secondary | ICD-10-CM | POA: Diagnosis not present

## 2015-03-31 DIAGNOSIS — R220 Localized swelling, mass and lump, head: Secondary | ICD-10-CM | POA: Insufficient documentation

## 2015-03-31 DIAGNOSIS — X58XXXA Exposure to other specified factors, initial encounter: Secondary | ICD-10-CM | POA: Insufficient documentation

## 2015-03-31 DIAGNOSIS — Z7982 Long term (current) use of aspirin: Secondary | ICD-10-CM | POA: Diagnosis not present

## 2015-03-31 DIAGNOSIS — T7840XA Allergy, unspecified, initial encounter: Secondary | ICD-10-CM | POA: Diagnosis present

## 2015-03-31 DIAGNOSIS — R51 Headache: Secondary | ICD-10-CM | POA: Diagnosis not present

## 2015-03-31 DIAGNOSIS — Y998 Other external cause status: Secondary | ICD-10-CM | POA: Diagnosis not present

## 2015-03-31 DIAGNOSIS — Z79899 Other long term (current) drug therapy: Secondary | ICD-10-CM | POA: Insufficient documentation

## 2015-03-31 DIAGNOSIS — R42 Dizziness and giddiness: Secondary | ICD-10-CM | POA: Insufficient documentation

## 2015-03-31 DIAGNOSIS — Y9289 Other specified places as the place of occurrence of the external cause: Secondary | ICD-10-CM | POA: Insufficient documentation

## 2015-03-31 MED ORDER — DEXAMETHASONE SODIUM PHOSPHATE 10 MG/ML IJ SOLN
10.0000 mg | Freq: Once | INTRAMUSCULAR | Status: AC
  Administered 2015-03-31: 10 mg via INTRAMUSCULAR
  Filled 2015-03-31: qty 1

## 2015-03-31 MED ORDER — PREDNISONE 20 MG PO TABS: ORAL_TABLET | ORAL | Status: DC

## 2015-03-31 MED ORDER — FAMOTIDINE 20 MG PO TABS: 20.0000 mg | ORAL_TABLET | Freq: Two times a day (BID) | ORAL | Status: DC

## 2015-03-31 NOTE — ED Notes (Signed)
Pt had a keratin treatment and now reports burning scalp and pain/swelling in face. nad noted.

## 2015-03-31 NOTE — ED Notes (Signed)
Pt reports treatment on Wed, itching and redness/swelling started Thursday.  Has been taking Benadryl but no relief.

## 2015-03-31 NOTE — ED Provider Notes (Signed)
CSN: [id number]     Arrival date & time 03/31/15  2222 History  This chart was scribed for Mia Moras, PA-C, working with Mia Chapel, MD by Steva Colder, ED Scribe. The patient was seen in room TR09C/TR09C at 10:41 PM.    Chief Complaint  Patient presents with  . Allergic Reaction      The history is provided by the patient. No language interpreter was used.    HPI Comments: Mia Robinson is a 44 y.o. female who presents to the Emergency Department complaining of allergic reaction onset 4 days ago. Pt had a keratin treatment done 5 days ago and she now has a burning sensation to her scalp with pain and swelling of her face. She has had an allergic reaction to the keratin treatment 1.5 years ago and last 1 week. Pt notes that this time she did the organic keratin thinking it would make a difference and she wouldn't have an allergic reaction. Pt notes that she washed her hair twice since the incident. She states that she is having associated symptoms of swelling/irritation around the neck and mild dizziness. She states that she has tried benadryl  And OTC sinus congestion medications with no relief for her symptoms. She denies throat closing sensation, SOB, abdominal pain, n/v, and any other symptoms.   Past Medical History  Diagnosis Date  . Anxiety   . Hypertension    Past Surgical History  Procedure Laterality Date  . Tubal ligation     Family History  Problem Relation Age of Onset  . Hypertension Father    History  Substance Use Topics  . Smoking status: Never Smoker   . Smokeless tobacco: Never Used  . Alcohol Use: No   OB History    Gravida Para Term Preterm AB TAB SAB Ectopic Multiple Living   3 2 2  1  1   2      Review of Systems  HENT:       No throat closing sensation. Scalp irritation  Respiratory: Negative for shortness of breath.   Gastrointestinal: Negative for nausea, vomiting and abdominal pain.  Neurological: Positive for dizziness.       Allergies  Review of patient's allergies indicates no known allergies.  Home Medications   Prior to Admission medications   Medication Sig Start Date End Date Taking? Authorizing Provider  Ascorbic Acid (VITAMIN C) 100 MG tablet Take 100 mg by mouth daily.    Historical Provider, MD  aspirin 81 MG tablet Take 81 mg by mouth daily.    Historical Provider, MD  cholecalciferol (VITAMIN D) 1000 UNITS tablet Take 1,000 Units by mouth daily.    Historical Provider, MD  famotidine (PEPCID) 20 MG tablet Take 1 tablet (20 mg total) by mouth 2 (two) times daily. 03/31/15   Mia Moras, PA-C  ibuprofen (ADVIL,MOTRIN) 800 MG tablet Take 1 tablet (800 mg total) by mouth every 8 (eight) hours as needed for pain. 01/05/13   Terrance Mass, MD  predniSONE (DELTASONE) 20 MG tablet 2 tabs po daily x 4 days 03/31/15   Mia Moras, PA-C  ramipril (ALTACE) 5 MG capsule Take 5 mg by mouth daily.    Historical Provider, MD  tranexamic acid (LYSTEDA) 650 MG TABS tablet Take two tablets three times a day for five days during menses 11/25/13   Terrance Mass, MD   BP 151/96 mmHg  Pulse 69  Temp(Src) 97.9 F (36.6 C) (Oral)  Resp 18  Ht 5\' 5"  (1.651 m)  Wt 148 lb (67.132 kg)  BMI 24.63 kg/m2  SpO2 99% Physical Exam  Constitutional: She is oriented to person, place, and time. She appears well-developed and well-nourished. No distress.  HENT:  Head: Normocephalic and atraumatic.  Scalp with significant localized skin erythema throughout her scalp. No evidence of alopecia. Posterior cervical LAD. Mild edema noted to the left side of face. No mucosal involvement.   Eyes: EOM are normal.  Neck: Neck supple. No tracheal deviation present.  Cardiovascular: Normal rate.   Pulmonary/Chest: Effort normal. No respiratory distress.  Abdominal: Soft. Bowel sounds are normal. There is no tenderness.  No evidence of hives throughout the body  Musculoskeletal: Normal range of motion.  Lymphadenopathy:    She has  cervical adenopathy.       Right cervical: Posterior cervical adenopathy present.       Left cervical: Posterior cervical adenopathy present.  Neurological: She is alert and oriented to person, place, and time.  Skin: Skin is warm and dry.  Psychiatric: She has a normal mood and affect. Her behavior is normal.  Nursing note and vitals reviewed.   ED Course  Procedures (including critical care time) DIAGNOSTIC STUDIES: Oxygen Saturation is 99% on RA, nl by my interpretation.    COORDINATION OF CARE: 10:47 PM-Discussed treatment plan which includes decadron injection and prednisone Rx, continue taking benadryl, Pepcid Rx, and f/u if the symptoms worsen with pt at bedside and pt agreed to plan.   Labs Review Labs Reviewed - No data to display  Imaging Review No results found.   EKG Interpretation None      MDM   Final diagnoses:  Allergic reaction, initial encounter    BP 151/96 mmHg  Pulse 69  Temp(Src) 97.9 F (36.6 C) (Oral)  Resp 18  Ht 5\' 5"  (1.651 m)  Wt 148 lb (67.132 kg)  BMI 24.63 kg/m2  SpO2 99%  I personally performed the services described in this documentation, which was scribed in my presence. The recorded information has been reviewed and is accurate.     Mia Moras, PA-C 03/31/15 [place], MD 04/01/15 [phone]

## 2015-03-31 NOTE — Discharge Instructions (Signed)
Alergias (Allergies) El profesional que lo asiste le ha diagnosticado que usted padece de [place]. Las [employer] pueden ser ocasionadas por cualquier cosa a la que su organismo es sensible. Pueden ser alimentos, medicamentos, polen, sustancias qumicas y casi cualquiera de las cosas que lo rodean en su vida diaria que producen alrgenos. Un alrgeno es todo lo que hace que una sustancia produzca alergia. La herencia es uno de los factores que causa este problema. Esto significa que usted puede sufrir alguna de las alergias que sufrieron sus [place]. Las [employer] a la comida pueden ocurrir a Hotel manager. Estn entre las ms graves y Haematologist en peligro la vida. Algunos de los alimentos que comnmente producen [place] son la [place], los frutos de mar, los [place], los frutos secos, el trigo y la soja. SNTOMAS  Hinchazn alrededor de la boca.  Una erupcin roja que produce picazn o urticaria.  Vmitos o diarrea.  Dificultad para respirar. LAS REACCIONES ALRGICAS GRAVES PONEN EN PELIGRO LA VIDA . Esta reaccin se denomina anafilaxis. Puede ocasionar que la boca y la garganta se hinchen y produzca dificultad para respirar y Chartered loss adjuster. En reacciones graves, slo una pequea cantidad del alimento (por ejemplo, aceite de cacahuate en la ensalada) puede producir la muerte en pocos segundos. Las [employer] pueden ocurrir a Hotel manager. Se denominan as porque generalmente se producen durante la misma estacin todos los aos. Puede ser Ardelia Mems reaccin al moho, al polen del csped o al polen de los rboles. Otras causas del problema son los alrgenos que contienen los caros del polvo del hogar, el pelaje de las mascotas y las esporas del moho. Los sntomas consisten en congestin nasal, picazn y secrecin nasal asociada con estornudos, y lagrimeo y [employer] ojos. Tambin puede haber picazn de la boca y los odos. Estos problemas aparecen cuando se entra en contacto con el polen  y otros alrgenos. Los alrgenos son las partculas que estn en el aire y a las que el organismo reacciona cuando existe una Risk analyst. Esto hace que usted libere anticuerpos alrgicos. A travs de una cadena de eventos, estos finalmente hacen que usted libere histamina en la corriente sangunea. Aunque esto implica una proteccin para su organismo, es lo que le produce disconfort. Ese es el motivo por el que se le han indicado antihistamnicos para sentirse mejor. Si usted no Lexicographer cul es el alrgeno que le produjo la reaccin, puede someterse a una prueba de [place] o de piel. Las alergias no pueden curarse pero pueden controlarse con medicamentos. La fiebre de heno es un grupo de trastornos alrgicos estacionales Simplemente se tratan con medicamentos de venta libre como difenhidramina (Benadryl). Tome los medicamentos segn las indicaciones. No consuma alcohol ni conduzca mientras toma este medicamento. Consulte con el profesional que lo asiste o siga las instrucciones de uso para las dosis para nios. Si estos medicamentos no le Training and development officer, existen muchos otros nuevos que el profesional que lo asiste puede prescribirle. Podrn utilizarse medicamentos ms fuertes tales como un spray nasal, colirios y corticoides si los primeros medicamentos que prueba no lo [place]. Si todos estos fracasan, puede Risk manager otros tratamientos como la inmunoterapia o las inyecciones desensibilizantes. Haga una consulta de seguimiento con el profesional que lo asiste si los problemas continan. Estas alergias estacionales no ponen en peligro la vida. Generalmente se trata de una incomodidad que puede aliviarse con medicamentos. INSTRUCCIONES PARA EL CUIDADO DOMICILIARIO  Si no est seguro de que es lo [place]  produce la reaccin, lleve un registro de los alimentos que come y los sntomas que le siguen. Evite los alimentos que le producen reacciones.  Si presenta urticaria o una erupcin  cutnea:  Tome los medicamentos como se le indic.  Puede utilizar un antihistamnico de venta libre (difenhidramina) para la urticaria y la picazn, segn sea necesario.  Aplquese compresas sobre la piel o tome baos de agua fra. Evite los baos o las duchas calientes. El calor puede hacer que la urticaria y la picazn empeoren.  Si usted es muy alrgico:  Como consecuencia de un tratamiento para una reaccin grave, puede necesitar ser hospitalizado para recibir un seguimiento intensivo.  Utilice un brazalete o collar de alerta mdico, indicando que usted es alrgico.  Usted y su familia deben aprender a administrar adrenalina o a utilizar un kit anafilctico.  Si usted ya ha sufrido una reaccin grave, siempre lleve el kit anafilctico o el EpiPen con usted. Si sufre una reaccin grave, utilice esta medicacin del modo en que se lo indic el profesional que lo asiste. Una falla puede conllevar consecuencias fatales. SOLICITE ATENCIN MDICA SI:  Sospecha que puede sufrir una alergia a algn alimento. Los sntomas generalmente ocurren dentro de los 30 minutos posteriores a haber ingerido el alimento.  Los sntomas persistieron durante 2 das o han empeorado.  Desarrolla nuevos sntomas.  Quiere volver a probar o que su hijo consuma nuevamente un alimento o bebida que usted cree que le causa una reaccin alrgica. Nunca lo haga si ha sufrido una reaccin anafilctica a ese alimento o a esa bebida con anterioridad. Slo intntelo bajo la supervisin del mdico. SOLICITE ATENCIN MDICA DE INMEDIATO SI:  Presenta dificultad para respirar, jadea o tiene una sensacin de opresin en el pecho o en la garganta.  Tiene la boca hinchada, o presenta urticaria, hinchazn o picazn en todo el cuerpo.  Ha sufrido una reaccin grave que ha respondido a medicamentos como el kit anafilctico o al EpiPen. Estas reacciones pueden volver a presentarse cuando haya terminado la medicacin. Estas  reacciones deben considerarse como que ponen en peligro la vida. EST SEGURO QUE:   Comprende las instrucciones para el alta mdica.  Controlar su enfermedad.  Solicitar atencin mdica de inmediato segn las indicaciones. Document Released: 08/18/2005 Document Revised: 12/13/2012 ExitCare Patient Information 2015 [employer]. This information is not intended to replace advice given to you by your health care provider. Make sure you discuss any questions you have with your health care provider.  

## 2015-10-23 ENCOUNTER — Other Ambulatory Visit: Payer: Self-pay | Admitting: Internal Medicine

## 2015-10-23 DIAGNOSIS — N63 Unspecified lump in unspecified breast: Secondary | ICD-10-CM

## 2015-10-29 ENCOUNTER — Other Ambulatory Visit: Payer: BC Managed Care – PPO

## 2015-10-29 ENCOUNTER — Inpatient Hospital Stay: Admission: RE | Admit: 2015-10-29 | Payer: BC Managed Care – PPO | Source: Ambulatory Visit

## 2015-11-16 ENCOUNTER — Other Ambulatory Visit: Payer: Self-pay | Admitting: Internal Medicine

## 2015-11-16 ENCOUNTER — Ambulatory Visit
Admission: RE | Admit: 2015-11-16 | Discharge: 2015-11-16 | Disposition: A | Discharge status: Home / Self Care | Payer: BC Managed Care – PPO | Source: Ambulatory Visit | Attending: Internal Medicine | Admitting: Internal Medicine

## 2015-11-16 DIAGNOSIS — N6310 Unspecified lump in the right breast, unspecified quadrant: Secondary | ICD-10-CM

## 2015-11-16 DIAGNOSIS — N63 Unspecified lump in unspecified breast: Secondary | ICD-10-CM

## 2017-01-14 ENCOUNTER — Encounter: Payer: Self-pay | Admitting: Gynecology

## 2017-09-15 ENCOUNTER — Encounter: Payer: Self-pay | Admitting: Family Medicine

## 2017-09-16 LAB — LIPID PANEL
HDL: 65 (ref 35–70)
LDL Cholesterol: 119
Triglycerides: 49 (ref 40–160)

## 2017-11-20 ENCOUNTER — Other Ambulatory Visit: Payer: Self-pay | Admitting: Internal Medicine

## 2017-11-20 DIAGNOSIS — D241 Benign neoplasm of right breast: Secondary | ICD-10-CM

## 2017-12-22 ENCOUNTER — Ambulatory Visit
Admission: RE | Admit: 2017-12-22 | Discharge: 2017-12-22 | Disposition: A | Discharge status: Home / Self Care | Payer: BC Managed Care – PPO | Source: Ambulatory Visit | Attending: Internal Medicine | Admitting: Internal Medicine

## 2017-12-22 ENCOUNTER — Other Ambulatory Visit: Payer: Self-pay | Admitting: Internal Medicine

## 2017-12-22 DIAGNOSIS — D241 Benign neoplasm of right breast: Secondary | ICD-10-CM

## 2017-12-22 DIAGNOSIS — N63 Unspecified lump in unspecified breast: Secondary | ICD-10-CM

## 2017-12-23 ENCOUNTER — Ambulatory Visit
Admission: RE | Admit: 2017-12-23 | Discharge: 2017-12-23 | Disposition: A | Discharge status: Home / Self Care | Payer: BC Managed Care – PPO | Source: Ambulatory Visit | Attending: Internal Medicine | Admitting: Internal Medicine

## 2017-12-23 ENCOUNTER — Other Ambulatory Visit: Payer: Self-pay | Admitting: Internal Medicine

## 2017-12-23 DIAGNOSIS — D241 Benign neoplasm of right breast: Secondary | ICD-10-CM

## 2017-12-23 DIAGNOSIS — N6002 Solitary cyst of left breast: Secondary | ICD-10-CM

## 2017-12-25 LAB — BASIC METABOLIC PANEL
BUN: 12 (ref 4–21)
CO2: 26 — AB (ref 13–22)
Chloride: 104 (ref 99–108)
Creatinine: 0.5 (ref 0.5–1.1)
Glucose: 87
Potassium: 4.2 (ref 3.4–5.3)
Sodium: 140 (ref 137–147)

## 2017-12-25 LAB — HEPATIC FUNCTION PANEL
ALT: 10 (ref 7–35)
AST: 15 (ref 13–35)
Alkaline Phosphatase: 71 (ref 25–125)
Bilirubin, Total: 0.3

## 2017-12-25 LAB — COMPREHENSIVE METABOLIC PANEL
Albumin: 4.4 (ref 3.5–5.0)
Calcium: 9.4 (ref 8.7–10.7)

## 2018-02-09 ENCOUNTER — Encounter: Payer: Self-pay | Admitting: Women's Health

## 2018-02-09 ENCOUNTER — Ambulatory Visit (INDEPENDENT_AMBULATORY_CARE_PROVIDER_SITE_OTHER): Payer: BC Managed Care – PPO | Admitting: Women's Health

## 2018-02-09 VITALS — BP 126/84 | Ht 65.0 in | Wt 156.0 lb

## 2018-02-09 DIAGNOSIS — Z01419 Encounter for gynecological examination (general) (routine) without abnormal findings: Secondary | ICD-10-CM

## 2018-02-09 NOTE — Addendum Note (Signed)
Addended by: Lorine Bears on: 02/09/2018 12:26 PM   Modules accepted: Orders

## 2018-02-09 NOTE — [facility] Notes (Signed)
Mia Robinson 09/05/1970 [id number]    History:    Presents for annual exam.  Cycles becoming more irregular this past year last cycle last month.  Not sexually active in greater than 3 years, divorced, denies need for STD screen.  BTL.  Normal Pap and mammogram history, had a breast cyst aspirated April 2019.  2015- breast biopsy.  Primary care manages labs.  Past medical history, past surgical history, family history and social history were all reviewed and documented in the EPIC chart.  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  2 daughters ages 73 and 36 both doing well.  Originally from [place].  Mother healthy, father hypertension.  ROS:  A ROS was performed and pertinent positives and negatives are included.  Exam:  Vitals:   02/09/18 1039  BP: 126/84  Weight: 156 lb (70.8 kg)  Height: 5\' 5"  (1.651 m)   Body mass index is 25.96 kg/m.   General appearance:  Normal Thyroid:  Symmetrical, normal in size, without palpable masses or nodularity. Respiratory  Auscultation:  Clear without wheezing or rhonchi Cardiovascular  Auscultation:  Regular rate, without rubs, murmurs or gallops  Edema/varicosities:  Not grossly evident Abdominal  Soft,nontender, without masses, guarding or rebound.  Liver/spleen:  No organomegaly noted  Hernia:  None appreciated  Skin  Inspection:  Grossly normal   Breasts: Examined lying and sitting.     Right: Without masses, retractions, discharge or axillary adenopathy.     Left: Without masses, retractions, discharge or axillary adenopathy. Gentitourinary   Inguinal/mons:  Normal without inguinal adenopathy  External genitalia:  Normal  BUS/Urethra/Skene's glands:  Normal  Vagina:  Normal  Cervix:  Normal  Uterus:  normal in size, shape and contour.  Midline and mobile  Adnexa/parametria:     Rt: Without masses or tenderness.   Lt: Without masses or tenderness.  Anus and perineum: Normal  Digital rectal exam: Normal sphincter tone  without palpated masses or tenderness  Assessment/Plan:  47 y.o. DHF G3, P2 for annual exam no complaints.  Perimenopausal/ irregular cycles/BTL/not sexually active History of breast cysts Labs-primary care  Plan: SBE's, continue annual screening mammogram, calcium rich foods, vitamin D 1000 daily encouraged.  Encouraged to continue healthy lifestyle of regular exercise and healthy diet.  Denies need for contraception.  Pap with HR HPV typing, new screening guidelines reviewed.    [place], 11:09 AM 02/09/2018

## 2018-02-09 NOTE — Patient Instructions (Signed)
Vit D 1000 daily Health Maintenance, Female Adopting a healthy lifestyle and getting preventive care can go a long way to promote health and wellness. Talk with your health care provider about what schedule of regular examinations is right for you. This is a good chance for you to check in with your provider about disease prevention and staying healthy. In between checkups, there are plenty of things you can do on your own. Experts have done a lot of research about which lifestyle changes and preventive measures are most likely to keep you healthy. Ask your health care provider for more information. Weight and diet Eat a healthy diet  Be sure to include plenty of vegetables, fruits, low-fat dairy products, and lean protein.  Do not eat a lot of foods high in solid fats, added sugars, or salt.  Get regular exercise. This is one of the most important things you can do for your health. ? Most adults should exercise for at least 150 minutes each week. The exercise should increase your heart rate and make you sweat (moderate-intensity exercise). ? Most adults should also do strengthening exercises at least twice a week. This is in addition to the moderate-intensity exercise.  Maintain a healthy weight  Body mass index (BMI) is a measurement that can be used to identify possible weight problems. It estimates body fat based on height and weight. Your health care provider can help determine your BMI and help you achieve or maintain a healthy weight.  For females 55 years of age and older: ? A BMI below 18.5 is considered underweight. ? A BMI of 18.5 to 24.9 is normal. ? A BMI of 25 to 29.9 is considered overweight. ? A BMI of 30 and above is considered obese.  Watch levels of cholesterol and blood lipids  You should start having your blood tested for lipids and cholesterol at 45 years of age, then have this test every 5 years.  You may need to have your cholesterol levels checked more often  if: ? Your lipid or cholesterol levels are high. ? You are older than 45 years of age. ? You are at high risk for heart disease.  Cancer screening Lung Cancer  Lung cancer screening is recommended for adults 54-68 years old who are at high risk for lung cancer because of a history of smoking.  A yearly low-dose CT scan of the lungs is recommended for people who: ? Currently smoke. ? Have quit within the past 15 years. ? Have at least a 30-pack-year history of smoking. A pack year is smoking an average of one pack of cigarettes a day for 1 year.  Yearly screening should continue until it has been 15 years since you quit.  Yearly screening should stop if you develop a health problem that would prevent you from having lung cancer treatment.  Breast Cancer  Practice breast self-awareness. This means understanding how your breasts normally appear and feel.  It also means doing regular breast self-exams. Let your health care provider know about any changes, no matter how small.  If you are in your 20s or 30s, you should have a clinical breast exam (CBE) by a health care provider every 1-3 years as part of a regular health exam.  If you are 41 or older, have a CBE every year. Also consider having a breast X-ray (mammogram) every year.  If you have a family history of breast cancer, talk to your health care provider about genetic screening.  If you  are at high risk for breast cancer, talk to your health care provider about having an MRI and a mammogram every year.  Breast cancer gene (BRCA) assessment is recommended for women who have family members with BRCA-related cancers. BRCA-related cancers include: ? Breast. ? Ovarian. ? Tubal. ? Peritoneal cancers.  Results of the assessment will determine the need for genetic counseling and BRCA1 and BRCA2 testing.  Cervical Cancer Your health care provider may recommend that you be screened regularly for cancer of the pelvic organs  (ovaries, uterus, and vagina). This screening involves a pelvic examination, including checking for microscopic changes to the surface of your cervix (Pap test). You may be encouraged to have this screening done every 3 years, beginning at age 72.  For women ages 67-65, health care providers may recommend pelvic exams and Pap testing every 3 years, or they may recommend the Pap and pelvic exam, combined with testing for human papilloma virus (HPV), every 5 years. Some types of HPV increase your risk of cervical cancer. Testing for HPV may also be done on women of any age with unclear Pap test results.  Other health care providers may not recommend any screening for nonpregnant women who are considered low risk for pelvic cancer and who do not have symptoms. Ask your health care provider if a screening pelvic exam is right for you.  If you have had past treatment for cervical cancer or a condition that could lead to cancer, you need Pap tests and screening for cancer for at least 20 years after your treatment. If Pap tests have been discontinued, your risk factors (such as having a new sexual partner) need to be reassessed to determine if screening should resume. Some women have medical problems that increase the chance of getting cervical cancer. In these cases, your health care provider may recommend more frequent screening and Pap tests.  Colorectal Cancer  This type of cancer can be detected and often prevented.  Routine colorectal cancer screening usually begins at 45 years of age and continues through 45 years of age.  Your health care provider may recommend screening at an earlier age if you have risk factors for colon cancer.  Your health care provider may also recommend using home test kits to check for hidden blood in the stool.  A small camera at the end of a tube can be used to examine your colon directly (sigmoidoscopy or colonoscopy). This is done to check for the earliest forms of  colorectal cancer.  Routine screening usually begins at age 74.  Direct examination of the colon should be repeated every 5-10 years through 45 years of age. However, you may need to be screened more often if early forms of precancerous polyps or small growths are found.  Skin Cancer  Check your skin from head to toe regularly.  Tell your health care provider about any new moles or changes in moles, especially if there is a change in a mole's shape or color.  Also tell your health care provider if you have a mole that is larger than the size of a pencil eraser.  Always use sunscreen. Apply sunscreen liberally and repeatedly throughout the day.  Protect yourself by wearing long sleeves, pants, a wide-brimmed hat, and sunglasses whenever you are outside.  Heart disease, diabetes, and high blood pressure  High blood pressure causes heart disease and increases the risk of stroke. High blood pressure is more likely to develop in: ? People who have blood pressure in  the high end of the normal range (130-139/85-89 mm Hg). ? People who are overweight or obese. ? People who are African American.  If you are 18-39 years of age, have your blood pressure checked every 3-5 years. If you are 40 years of age or older, have your blood pressure checked every year. You should have your blood pressure measured twice-once when you are at a hospital or clinic, and once when you are not at a hospital or clinic. Record the average of the two measurements. To check your blood pressure when you are not at a hospital or clinic, you can use: ? An automated blood pressure machine at a pharmacy. ? A home blood pressure monitor.  If you are between 55 years and 79 years old, ask your health care provider if you should take aspirin to prevent strokes.  Have regular diabetes screenings. This involves taking a blood sample to check your fasting blood sugar level. ? If you are at a normal weight and have a low risk  for diabetes, have this test once every three years after 45 years of age. ? If you are overweight and have a high risk for diabetes, consider being tested at a younger age or more often. Preventing infection Hepatitis B  If you have a higher risk for hepatitis B, you should be screened for this virus. You are considered at high risk for hepatitis B if: ? You were born in a country where hepatitis B is common. Ask your health care provider which countries are considered high risk. ? Your parents were born in a high-risk country, and you have not been immunized against hepatitis B (hepatitis B vaccine). ? You have HIV or AIDS. ? You use needles to inject street drugs. ? You live with someone who has hepatitis B. ? You have had sex with someone who has hepatitis B. ? You get hemodialysis treatment. ? You take certain medicines for conditions, including cancer, organ transplantation, and autoimmune conditions.  Hepatitis C  Blood testing is recommended for: ? Everyone born from 1945 through 1965. ? Anyone with known risk factors for hepatitis C.  Sexually transmitted infections (STIs)  You should be screened for sexually transmitted infections (STIs) including gonorrhea and chlamydia if: ? You are sexually active and are younger than 45 years of age. ? You are older than 45 years of age and your health care provider tells you that you are at risk for this type of infection. ? Your sexual activity has changed since you were last screened and you are at an increased risk for chlamydia or gonorrhea. Ask your health care provider if you are at risk.  If you do not have HIV, but are at risk, it may be recommended that you take a prescription medicine daily to prevent HIV infection. This is called pre-exposure prophylaxis (PrEP). You are considered at risk if: ? You are sexually active and do not regularly use condoms or know the HIV status of your partner(s). ? You take drugs by  injection. ? You are sexually active with a partner who has HIV.  Talk with your health care provider about whether you are at high risk of being infected with HIV. If you choose to begin PrEP, you should first be tested for HIV. You should then be tested every 3 months for as long as you are taking PrEP. Pregnancy  If you are premenopausal and you may become pregnant, ask your health care provider about preconception counseling.    If you may become pregnant, take 400 to 800 micrograms (mcg) of folic acid every day.  If you want to prevent pregnancy, talk to your health care provider about birth control (contraception). Osteoporosis and menopause  Osteoporosis is a disease in which the bones lose minerals and strength with aging. This can result in serious bone fractures. Your risk for osteoporosis can be identified using a bone density scan.  If you are 17 years of age or older, or if you are at risk for osteoporosis and fractures, ask your health care provider if you should be screened.  Ask your health care provider whether you should take a calcium or vitamin D supplement to lower your risk for osteoporosis.  Menopause may have certain physical symptoms and risks.  Hormone replacement therapy may reduce some of these symptoms and risks. Talk to your health care provider about whether hormone replacement therapy is right for you. Follow these instructions at home:  Schedule regular health, dental, and eye exams.  Stay current with your immunizations.  Do not use any tobacco products including cigarettes, chewing tobacco, or electronic cigarettes.  If you are pregnant, do not drink alcohol.  If you are breastfeeding, limit how much and how often you drink alcohol.  Limit alcohol intake to no more than 1 drink per day for nonpregnant women. One drink equals 12 ounces of beer, 5 ounces of wine, or 1 ounces of hard liquor.  Do not use street drugs.  Do not share needles.  Ask  your health care provider for help if you need support or information about quitting drugs.  Tell your health care provider if you often feel depressed.  Tell your health care provider if you have ever been abused or do not feel safe at home. This information is not intended to replace advice given to you by your health care provider. Make sure you discuss any questions you have with your health care provider. Document Released: 03/03/2011 Document Revised: 01/24/2016 Document Reviewed: 05/22/2015 Elsevier Interactive Patient Education  [employer].

## 2018-02-10 LAB — PAP, TP IMAGING W/ HPV RNA, RFLX HPV TYPE 16,18/45: HPV DNA High Risk: NOT DETECTED

## 2018-06-25 ENCOUNTER — Ambulatory Visit
Admission: RE | Admit: 2018-06-25 | Discharge: 2018-06-25 | Disposition: A | Discharge status: Home / Self Care | Payer: BC Managed Care – PPO | Source: Ambulatory Visit | Attending: Internal Medicine | Admitting: Internal Medicine

## 2018-06-25 DIAGNOSIS — N6002 Solitary cyst of left breast: Secondary | ICD-10-CM

## 2019-03-23 ENCOUNTER — Other Ambulatory Visit: Payer: Self-pay

## 2019-03-23 DIAGNOSIS — Z20822 Contact with and (suspected) exposure to covid-19: Secondary | ICD-10-CM

## 2019-03-26 LAB — NOVEL CORONAVIRUS, NAA: SARS-CoV-2, NAA: NOT DETECTED

## 2019-03-31 ENCOUNTER — Telehealth: Payer: Self-pay

## 2019-03-31 NOTE — Telephone Encounter (Signed)
Pt. Called back, given COVID 19 results.

## 2019-08-02 ENCOUNTER — Other Ambulatory Visit: Payer: Self-pay | Admitting: Family Medicine

## 2019-08-02 ENCOUNTER — Other Ambulatory Visit: Payer: Self-pay | Admitting: Internal Medicine

## 2019-08-02 DIAGNOSIS — Z1231 Encounter for screening mammogram for malignant neoplasm of breast: Secondary | ICD-10-CM

## 2019-08-22 ENCOUNTER — Ambulatory Visit
Admission: RE | Admit: 2019-08-22 | Discharge: 2019-08-22 | Disposition: A | Discharge status: Home / Self Care | Payer: BC Managed Care – PPO | Source: Ambulatory Visit | Attending: Internal Medicine | Admitting: Internal Medicine

## 2019-08-22 ENCOUNTER — Other Ambulatory Visit: Payer: Self-pay

## 2019-08-22 DIAGNOSIS — Z1231 Encounter for screening mammogram for malignant neoplasm of breast: Secondary | ICD-10-CM

## 2019-09-12 ENCOUNTER — Encounter: Payer: Self-pay | Admitting: Family Medicine

## 2019-09-12 ENCOUNTER — Ambulatory Visit: Payer: BC Managed Care – PPO | Admitting: Family Medicine

## 2019-09-12 ENCOUNTER — Other Ambulatory Visit: Payer: Self-pay

## 2019-09-12 VITALS — BP 128/80 | HR 63 | Temp 96.4°F | Resp 12 | Ht 65.0 in | Wt 165.0 lb

## 2019-09-12 DIAGNOSIS — I10 Essential (primary) hypertension: Secondary | ICD-10-CM

## 2019-09-12 DIAGNOSIS — E538 Deficiency of other specified B group vitamins: Secondary | ICD-10-CM | POA: Insufficient documentation

## 2019-09-12 DIAGNOSIS — E559 Vitamin D deficiency, unspecified: Secondary | ICD-10-CM | POA: Insufficient documentation

## 2019-09-12 DIAGNOSIS — E785 Hyperlipidemia, unspecified: Secondary | ICD-10-CM | POA: Insufficient documentation

## 2019-09-12 DIAGNOSIS — Z131 Encounter for screening for diabetes mellitus: Secondary | ICD-10-CM

## 2019-09-12 LAB — VITAMIN D 25 HYDROXY (VIT D DEFICIENCY, FRACTURES): VITD: 56.45 ng/mL (ref 30.00–100.00)

## 2019-09-12 LAB — LIPID PANEL
Cholesterol: 189 mg/dL (ref 0–200)
HDL: 57 mg/dL (ref >39.00)
LDL Cholesterol: 117 mg/dL — ABNORMAL HIGH (ref 0–99)
NonHDL: 132.47
Total CHOL/HDL Ratio: 3
Triglycerides: 76 mg/dL (ref 0.0–149.0)
VLDL: 15.2 mg/dL (ref 0.0–40.0)

## 2019-09-12 LAB — CBC
HCT: 38.4 % (ref 36.0–46.0)
Hemoglobin: 12.9 g/dL (ref 12.0–15.0)
MCHC: 33.7 g/dL (ref 30.0–36.0)
MCV: 88.3 fl (ref 78.0–100.0)
Platelets: 261 10*3/uL (ref 150.0–400.0)
RBC: 4.35 Mil/uL (ref 3.87–5.11)
RDW: 13.2 % (ref 11.5–15.5)
WBC: 6.4 10*3/uL (ref 4.0–10.5)

## 2019-09-12 LAB — BASIC METABOLIC PANEL
BUN: 6 mg/dL (ref 6–23)
CO2: 32 mEq/L (ref 19–32)
Calcium: 10.2 mg/dL (ref 8.4–10.5)
Chloride: 101 mEq/L (ref 96–112)
Creatinine, Ser: 0.59 mg/dL (ref 0.40–1.20)
GFR: 109.64 mL/min (ref >60.00)
Glucose, Bld: 83 mg/dL (ref 70–99)
Potassium: 4.7 mEq/L (ref 3.5–5.1)
Sodium: 141 mEq/L (ref 135–145)

## 2019-09-12 LAB — HEMOGLOBIN A1C: Hgb A1c MFr Bld: 5.7 % (ref 4.6–6.5)

## 2019-09-12 LAB — VITAMIN B12: Vitamin B-12: >1500 pg/mL — ABNORMAL HIGH (ref 211–911)

## 2019-09-12 NOTE — Progress Notes (Signed)
HPI:   Mia Robinson is a 49 y.o. female, who is here today to establish care.  Former PCP: Dr Mellody Drown. Last preventive routine visit: 01/2018. She follows with gyn regularly, Ms Clearnce Hasten visit 01/2018.  Chronic medical problems: HTN,anxiety,B12 def,vit D def,and HLD among some.  She is on Bystolic 5 mg daily. She tried other different antihypertensive agents. BP is elevated when she is "emotional." Negative for severe/frequent headache, visual changes, chest pain, dyspnea, palpitation, claudication, focal weakness, or edema.  Concerns today: She would like labs done.  B12 deficiency: She is on B12 1000 mcg daily. Started medication to treat radicular back pain. Negative for numbness or tingling.  Vit D deficiency: She is on Vit D 5000 U daily. Dx'ed about a year ago.  Wonders if she is already menopausal. Menstrual periods irregular, every 3-6 months. LMP 5 months ago, lighter.  HLD: She is on non pharmacologic treatment. She exercises regularly and follows a healthful diet.  Review of Systems  Constitutional: Negative for activity change, appetite change, fatigue and fever.  HENT: Negative for mouth sores, nosebleeds and sore throat.   Respiratory: Negative for cough and wheezing.   Gastrointestinal: Negative for abdominal pain, nausea and vomiting.       Negative for changes in bowel habits.  Endocrine: Negative for polydipsia, polyphagia and polyuria.  Genitourinary: Positive for menstrual problem. Negative for decreased urine volume and hematuria.  Musculoskeletal: Negative for gait problem and myalgias.  Skin: Negative for pallor and rash.  Neurological: Negative for syncope, facial asymmetry and speech difficulty.  Psychiatric/Behavioral: Negative for confusion. The patient is nervous/anxious.   Rest see pertinent positives and negatives per HPI.   Current Outpatient Medications on File Prior to Visit  Medication Sig Dispense Refill    . Cholecalciferol (D-3-5) 125 MCG (5000 UT) capsule Take 5,000 Units by mouth daily.    . Magnesium Oxide (MAG-200 PO) Take 1 tablet by mouth daily.    . Multiple Vitamin (MULTIVITAMIN) tablet Take 1 tablet by mouth daily.    . nebivolol (BYSTOLIC) 5 MG tablet Take 5 mg by mouth daily.    Marland Kitchen UNABLE TO FIND Bone-Up 3x a day.    . vitamin B-12 (CYANOCOBALAMIN) 1000 MCG tablet Take 1,000 mcg by mouth daily.     No current facility-administered medications on file prior to visit.   Past Medical History:  Diagnosis Date  . Anxiety    No Known Allergies  Family History  Problem Relation Age of Onset  . Hypertension Father     Social History   Socioeconomic History  . Marital status: Divorced    Spouse name: Not on file  . Number of children: Not on file  . Years of education: Not on file  . Highest education level: Not on file  Occupational History  . Not on file  Tobacco Use  . Smoking status: Never Smoker  . Smokeless tobacco: Never Used  Substance and Sexual Activity  . Alcohol use: No  . Drug use: No  . Sexual activity: Not Currently    Birth control/protection: Surgical    Comment: TUBAL LIGATION, intercourse age 19 , more than 5 sexual partners  Other Topics Concern  . Not on file  Social History Narrative  . Not on file   Social Determinants of Health   Financial Resource Strain:   . Difficulty of Paying Living Expenses: Not on file  Food Insecurity:   . Worried About Charity fundraiser in the  Last Year: Not on file  . Ran Out of Food in the Last Year: Not on file  Transportation Needs:   . Lack of Transportation (Medical): Not on file  . Lack of Transportation (Non-Medical): Not on file  Physical Activity:   . Days of Exercise per Week: Not on file  . Minutes of Exercise per Session: Not on file  Stress:   . Feeling of Stress : Not on file  Social Connections:   . Frequency of Communication with Friends and Family: Not on file  . Frequency of Social  Gatherings with Friends and Family: Not on file  . Attends Religious Services: Not on file  . Active Member of Clubs or Organizations: Not on file  . Attends Archivist Meetings: Not on file  . Marital Status: Not on file    Vitals:   09/12/19 0958  BP: 128/80  Pulse: 63  Resp: 12  Temp: (!) 96.4 F (35.8 C)  SpO2: 98%    Body mass index is 27.46 kg/m.  Physical Exam  Nursing note and vitals reviewed. Constitutional: She is oriented to person, place, and time. She appears well-developed. No distress.  HENT:  Head: Normocephalic and atraumatic.  Mouth/Throat: Oropharynx is clear and moist and mucous membranes are normal.  Eyes: Pupils are equal, round, and reactive to light. Conjunctivae are normal.  Cardiovascular: Normal rate and regular rhythm.  No murmur heard. Pulses:      Dorsalis pedis pulses are 2+ on the right side and 2+ on the left side.  Respiratory: Effort normal and breath sounds normal. No respiratory distress.  GI: Soft. She exhibits no mass. There is no hepatomegaly. There is no abdominal tenderness.  Musculoskeletal:        General: No edema.  Lymphadenopathy:    She has no cervical adenopathy.  Neurological: She is alert and oriented to person, place, and time. She has normal strength. No cranial nerve deficit. Gait normal.  Skin: Skin is warm. No rash noted. No erythema.  Psychiatric: She has a normal mood and affect.  Well groomed, good eye contact.    ASSESSMENT AND PLAN:  Ms. Amadis was seen today for establish care.  Diagnoses and all orders for this visit:  Lab Results  Component Value Date   WBC 6.4 09/12/2019   HGB 12.9 09/12/2019   HCT 38.4 09/12/2019   MCV 88.3 09/12/2019   PLT 261.0 09/12/2019   Lab Results  Component Value Date   CREATININE 0.59 09/12/2019   BUN 6 09/12/2019   NA 141 09/12/2019   K 4.7 09/12/2019   CL 101 09/12/2019   CO2 32 09/12/2019   Lab Results  Component Value Date   VITAMINB12 >1500  (H) 09/12/2019   Lab Results  Component Value Date   CHOL 189 09/12/2019   HDL 57.00 09/12/2019   LDLCALC 117 (H) 09/12/2019   TRIG 76.0 09/12/2019   CHOLHDL 3 09/12/2019   Lab Results  Component Value Date   HGBA1C 5.7 09/12/2019    Hypertension, essential, benign BP adequately controlled. Continue Bystolic 5 mg daily. Recommend monitoring BP regularly, goal <=130/80. Some side effects of mediation.  -     Basic Metabolic Panel -     CBC  Vitamin D deficiency, unspecified No changes in current management, will follow labs done today and will give further recommendations accordingly.  -     VITAMIN D 25 Hydroxy (Vit-D Deficiency, Fractures)  Hyperlipidemia, unspecified hyperlipidemia type Continue low fat diet. Further  recommendations will be given according to 10 years CVD risk and lipid panel results.   -     Lipid panel The 10-year ASCVD risk score Mikey Bussing DC Jr., et al., 2013) is: 1%   Values used to calculate the score:     Age: 63 years     Sex: Female     Is Non-Hispanic African American: No     Diabetic: No     Tobacco smoker: No     Systolic Blood Pressure: 0000000 mmHg     Is BP treated: Yes     HDL Cholesterol: 57 mg/dL     Total Cholesterol: 189 mg/dL  B12 deficiency No changes in current management,dose of B12 will be adjusted according to lab results.  -     Vitamin B12 -     CBC  Diabetes mellitus screening -     Hemoglobin A1c     Return in about 1 year (around 09/11/2020) for cpe.     Duel Conrad G. [place], MD  [facility]. [place] office.

## 2019-09-12 NOTE — Patient Instructions (Addendum)
A few things to remember from today's visit:   Hypertension, essential, benign - Plan: Basic Metabolic Panel, CBC  Vitamin D deficiency, unspecified - Plan: VITAMIN D 25 Hydroxy (Vit-D Deficiency, Fractures)  Hyperlipidemia, unspecified hyperlipidemia type - Plan: Lipid panel  B12 deficiency - Plan: Vitamin B12, CBC  Diabetes mellitus screening - Plan: Hemoglobin A1c  No changes today. Check blood pressure regularmente.  Please be sure medication list is accurate. If a new problem present, please set up appointment sooner than planned today.

## 2019-10-03 ENCOUNTER — Telehealth: Payer: Self-pay | Admitting: Family Medicine

## 2019-10-03 NOTE — Telephone Encounter (Signed)
[facility] Dept faxed request for medical records to Jilda Panda MD 10/03/19  [facility]

## 2019-10-04 ENCOUNTER — Ambulatory Visit: Payer: BC Managed Care – PPO | Attending: Internal Medicine

## 2019-10-04 DIAGNOSIS — Z20822 Contact with and (suspected) exposure to covid-19: Secondary | ICD-10-CM

## 2019-10-05 LAB — NOVEL CORONAVIRUS, NAA: SARS-CoV-2, NAA: NOT DETECTED

## 2019-10-14 ENCOUNTER — Encounter: Payer: Self-pay | Admitting: Family Medicine

## 2019-10-19 ENCOUNTER — Telehealth: Payer: Self-pay | Admitting: Family Medicine

## 2019-10-19 MED ORDER — NEBIVOLOL HCL 5 MG PO TABS: 5.0000 mg | ORAL_TABLET | Freq: Every day | ORAL | 2 refills | Status: DC

## 2019-10-19 NOTE — Telephone Encounter (Signed)
Rx refilled as requested.  

## 2019-10-19 NOTE — Telephone Encounter (Signed)
Medication Refill: Nebivolol 5mg   Pharmacy: [facility]  Phone:

## 2019-11-01 ENCOUNTER — Encounter: Payer: Self-pay | Admitting: Family Medicine

## 2019-11-01 ENCOUNTER — Telehealth (INDEPENDENT_AMBULATORY_CARE_PROVIDER_SITE_OTHER): Payer: BC Managed Care – PPO | Admitting: Family Medicine

## 2019-11-01 VITALS — Ht 65.0 in

## 2019-11-01 DIAGNOSIS — E663 Overweight: Secondary | ICD-10-CM | POA: Diagnosis not present

## 2019-11-01 DIAGNOSIS — M79604 Pain in right leg: Secondary | ICD-10-CM | POA: Diagnosis not present

## 2019-11-01 DIAGNOSIS — I8390 Asymptomatic varicose veins of unspecified lower extremity: Secondary | ICD-10-CM | POA: Diagnosis not present

## 2019-11-01 NOTE — Progress Notes (Signed)
Virtual Visit via Video Note   I connected with [name] on 11/01/19 by a video enabled telemedicine application and verified that I am speaking with the correct person using two identifiers.  Location patient: home Location provider:work office Persons participating in the virtual visit: patient, provider  I discussed the limitations of evaluation and management by telemedicine and the availability of in person appointments. The patient expressed understanding and agreed to proceed.  Chief Complaint  Patient presents with  . Circulatory Problem    HPI: Mia Robinson is a 49 yo female with hx of HTN,headaches,and HLD  c/o a week of constant RLE pain.  She has been having pain,points medial aspect of proximal right calf. Soreness, 7/10, mildly better today 5/10. No hx of trauma, long travel,recent surgery,or on OCP's.  She has not noted cyanosis,cold extremity, or erythema. Not sure about edema.  She thinks pain may be caused by varicose veins. It is exacerbated by prolonged waking and palpation. Alleviated by massage with oil and rest. She has not taken OTC medications.  For years she has had varicose veins in lower extremities, R>L, problem seems to be getting worse.  She noted some SOB when walking.but has had it intermittently for 1-2 months.She thinks it is caused by wt gain. She is asking if she can "have something" for wt loss.  She is also reporting episodes of palpitations last week, states that she has had similar episodes before caused by stress. No associated CP,diaphoresis,dizziness,or syncope.  Denies fever,chills,sore throat,abdominal pain,N/V,changes in bowel habits, or urinary symptoms.  ROS: See pertinent positives and negatives per HPI.  Past Medical History:  Diagnosis Date  . Anxiety     Past Surgical History:  Procedure Laterality Date  . BREAST BIOPSY Left 2015  . TUBAL LIGATION      Family History  Problem Relation Age of Onset  . Hypertension  Father     Social History   Socioeconomic History  . Marital status: Single    Spouse name: Not on file  . Number of children: Not on file  . Years of education: Not on file  . Highest education level: Not on file  Occupational History  . Not on file  Tobacco Use  . Smoking status: Never Smoker  . Smokeless tobacco: Never Used  Substance and Sexual Activity  . Alcohol use: No  . Drug use: No  . Sexual activity: Not Currently    Birth control/protection: Surgical    Comment: TUBAL LIGATION, intercourse age 43 , more than 5 sexual partners  Other Topics Concern  . Not on file  Social History Narrative  . Not on file   Social Determinants of Health   Financial Resource Strain:   . Difficulty of Paying Living Expenses: Not on file  Food Insecurity:   . Worried About Charity fundraiser in the Last Year: Not on file  . Ran Out of Food in the Last Year: Not on file  Transportation Needs:   . Lack of Transportation (Medical): Not on file  . Lack of Transportation (Non-Medical): Not on file  Physical Activity:   . Days of Exercise per Week: Not on file  . Minutes of Exercise per Session: Not on file  Stress:   . Feeling of Stress : Not on file  Social Connections:   . Frequency of Communication with Friends and Family: Not on file  . Frequency of Social Gatherings with Friends and Family: Not on file  . Attends Religious Services:  Not on file  . Active Member of Clubs or Organizations: Not on file  . Attends Archivist Meetings: Not on file  . Marital Status: Not on file  Intimate Partner Violence:   . Fear of Current or Ex-Partner: Not on file  . Emotionally Abused: Not on file  . Physically Abused: Not on file  . Sexually Abused: Not on file    Current Outpatient Medications:  .  Cholecalciferol (D-3-5) 125 MCG (5000 UT) capsule, Take 5,000 Units by mouth daily., Disp: , Rfl:  .  Magnesium Oxide (MAG-200 PO), Take 1 tablet by mouth daily., Disp: , Rfl:   .  Multiple Vitamin (MULTIVITAMIN) tablet, Take 1 tablet by mouth daily., Disp: , Rfl:  .  nebivolol (BYSTOLIC) 5 MG tablet, Take 1 tablet (5 mg total) by mouth daily., Disp: 90 tablet, Rfl: 2 .  UNABLE TO FIND, Bone-Up 3x a day., Disp: , Rfl:  .  vitamin B-12 (CYANOCOBALAMIN) 1000 MCG tablet, Take 1,000 mcg by mouth daily., Disp: , Rfl:   EXAM:  VITALS per patient if applicable:Ht 5\' 5"  (1.651 m)   LMP 01/20/2019   BMI 27.46 kg/m   GENERAL: alert, oriented, appears well and in no acute distress  HEENT: atraumatic, conjunctiva clear, no obvious abnormalities on inspection.  NECK: normal movements of the head and neck  LUNGS: on inspection no signs of respiratory distress, breathing rate appears normal, no obvious gross SOB, gasping or wheezing  CV: no obvious cyanosis  Mia: moves all visible extremities without noticeable abnormality  PSYCH/NEURO: pleasant and cooperative, no obvious depression or anxiety, speech and thought processing grossly intact  ASSESSMENT AND PLAN:  Discussed the following assessment and plan:  Pain of right lower extremity - Plan: VAS [place] LOWER EXTREMITY VENOUS (DVT), CANCELED: US Venous Img Lower Bilateral Some associated symptoms like SOB and palpitation do not seem to be new symptoms. Difficult to obtain specifics. We discussed possible etiologies. LE [place] will be arranged. She was clearly instructed about warning signs.  Varicose veins of lower extremity, unspecified laterality, unspecified whether complicated Compression stocking and LE elevation recommended. We can consider vein specialist if problem continues getting worse. F/U in the office if needed.  Overweight (BMI 25.0-29.9) Educated about obesity and treatment options. Pharmacologic options are used for short period of time. Pharmacologic treatment is not indicated. Consistency with healthy diet and physical activity recommended.   I discussed the assessment and treatment plan with  the patient. Mia Robinson was provided an opportunity to ask questions and all were answered. She agreed with the plan and demonstrated an understanding of the instructions.    Return if symptoms worsen or fail to improve.    Mia Locken [place], MD

## 2019-11-02 ENCOUNTER — Ambulatory Visit (HOSPITAL_COMMUNITY)
Admission: RE | Admit: 2019-11-02 | Discharge: 2019-11-02 | Disposition: A | Discharge status: Home / Self Care | Payer: BC Managed Care – PPO | Source: Ambulatory Visit | Attending: Internal Medicine | Admitting: Internal Medicine

## 2019-11-02 ENCOUNTER — Other Ambulatory Visit: Payer: Self-pay

## 2019-11-02 DIAGNOSIS — M79604 Pain in right leg: Secondary | ICD-10-CM | POA: Diagnosis present

## 2019-11-03 ENCOUNTER — Encounter: Payer: Self-pay | Admitting: Family Medicine

## 2020-06-20 ENCOUNTER — Other Ambulatory Visit: Payer: Self-pay | Admitting: Internal Medicine

## 2020-06-20 DIAGNOSIS — Z1231 Encounter for screening mammogram for malignant neoplasm of breast: Secondary | ICD-10-CM

## 2020-06-22 ENCOUNTER — Encounter: Payer: Self-pay | Admitting: Family Medicine

## 2020-06-22 ENCOUNTER — Telehealth (INDEPENDENT_AMBULATORY_CARE_PROVIDER_SITE_OTHER): Payer: BC Managed Care – PPO | Admitting: Family Medicine

## 2020-06-22 VITALS — Ht 65.0 in

## 2020-06-22 DIAGNOSIS — R1031 Right lower quadrant pain: Secondary | ICD-10-CM | POA: Diagnosis not present

## 2020-06-22 DIAGNOSIS — M79604 Pain in right leg: Secondary | ICD-10-CM | POA: Diagnosis not present

## 2020-06-22 MED ORDER — CELECOXIB 100 MG PO CAPS: 100.0000 mg | ORAL_CAPSULE | Freq: Two times a day (BID) | ORAL | 0 refills | Status: DC

## 2020-06-22 NOTE — Progress Notes (Signed)
Virtual Visit via Video Note I connected with Mia Robinson on 06/22/20 by a video enabled telemedicine application and verified that I am speaking with the correct person using two identifiers.  Location patient: home Location provider:work office Persons participating in the virtual visit: patient, provider  I discussed the limitations of evaluation and management by telemedicine and the availability of in person appointments. The patient expressed understanding and agreed to proceed.  Chief Complaint  Patient presents with  . Leg Pain   HPI: Mia Robinson is a 49 yo female c/o RLE pain. Problem has been intermittently for years. She thinks it may be sciatic pain. Pain started 3-4 days ago,gradual onset.  She felt like it was getting better but today got worse again. Starts in right groin and extends along medial aspect of distal extremity.  Constant, now 3/10, max 5-6/10. Pain is exacerbated by bearing weight and walking. It is alleviated by raising leg,external rotation of hip, and rest. She has not noted edema,erythema,or skin rash. In groin area she has not noted deformity. It is not aggravate by lifting.  She has not had back pain. "Little" numbness sometimes. Negative for fever,chills,abnormal wt loss,abdominal pain,N/V,vaginal bleeding/discharge,or urinary symptoms. A week ago she had diarrhea x 24 hours, she thinks it was a "stomach bug." LMP: N/A, post menopausal.  She has not had recent trauma. She is planning on moving to a new place,so she has been packing and moving furniture.  She has not taken OTC medication.  Negative for CP,SOB,palpitations,or diaphoresis.  ROS: See pertinent positives and negatives per HPI.  Past Medical History:  Diagnosis Date  . Anxiety     Past Surgical History:  Procedure Laterality Date  . BREAST BIOPSY Left 2015  . TUBAL LIGATION      Family History  Problem Relation Age of Onset  . Hypertension Father      Social History   Socioeconomic History  . Marital status: Single    Spouse name: Not on file  . Number of children: Not on file  . Years of education: Not on file  . Highest education level: Not on file  Occupational History  . Not on file  Tobacco Use  . Smoking status: Never Smoker  . Smokeless tobacco: Never Used  Vaping Use  . Vaping Use: Never used  Substance and Sexual Activity  . Alcohol use: No  . Drug use: No  . Sexual activity: Not Currently    Birth control/protection: Surgical    Comment: TUBAL LIGATION, intercourse age 62 , more than 5 sexual partners  Other Topics Concern  . Not on file  Social History Narrative  . Not on file   Social Determinants of Health   Financial Resource Strain:   . Difficulty of Paying Living Expenses: Not on file  Food Insecurity:   . Worried About Charity fundraiser in the Last Year: Not on file  . Ran Out of Food in the Last Year: Not on file  Transportation Needs:   . Lack of Transportation (Medical): Not on file  . Lack of Transportation (Non-Medical): Not on file  Physical Activity:   . Days of Exercise per Week: Not on file  . Minutes of Exercise per Session: Not on file  Stress:   . Feeling of Stress : Not on file  Social Connections:   . Frequency of Communication with Friends and Family: Not on file  . Frequency of Social Gatherings with Friends and Family: Not on file  .  Attends Religious Services: Not on file  . Active Member of Clubs or Organizations: Not on file  . Attends Archivist Meetings: Not on file  . Marital Status: Not on file  Intimate Partner Violence:   . Fear of Current or Ex-Partner: Not on file  . Emotionally Abused: Not on file  . Physically Abused: Not on file  . Sexually Abused: Not on file    Current Outpatient Medications:  .  Cholecalciferol (D-3-5) 125 MCG (5000 UT) capsule, Take 5,000 Units by mouth daily., Disp: , Rfl:  .  Magnesium Oxide (MAG-200 PO), Take 1 tablet  by mouth daily., Disp: , Rfl:  .  Multiple Vitamin (MULTIVITAMIN) tablet, Take 1 tablet by mouth daily., Disp: , Rfl:  .  nebivolol (BYSTOLIC) 5 MG tablet, Take 1 tablet (5 mg total) by mouth daily., Disp: 90 tablet, Rfl: 2 .  UNABLE TO FIND, Bone-Up 3x a day., Disp: , Rfl:  .  vitamin B-12 (CYANOCOBALAMIN) 1000 MCG tablet, Take 1,000 mcg by mouth daily., Disp: , Rfl:  .  celecoxib (CELEBREX) 100 MG capsule, Take 1 capsule (100 mg total) by mouth 2 (two) times daily for 10 days., Disp: 20 capsule, Rfl: 0  EXAM:  VITALS per patient if applicable:Ht 5\' 5"  (1.651 m)   LMP 01/20/2019   BMI 27.46 kg/m   GENERAL: alert, oriented, appears well and in no acute distress  HEENT: atraumatic, conjunctiva clear, no obvious abnormalities on inspection.  NECK: normal movements of the head and neck  LUNGS: on inspection no signs of respiratory distress, breathing rate appears normal, no obvious gross SOB, gasping or wheezing  CV: no obvious cyanosis  Mia: moves all visible extremities without noticeable abnormality. Lying on floor with RLE elevated. Mild pain upon palpation of right groin, she does not appreciated masses,prominence,or pain with valsalva (asked to do push ups).  PSYCH/NEURO: pleasant and cooperative, no obvious depression or anxiety, speech and thought processing grossly intact  ASSESSMENT AND PLAN:  Discussed the following assessment and plan:  Right inguinal pain - Plan: celecoxib (CELEBREX) 100 MG capsule Possible etiologies discussed. ? Muscle strain. Celebrex side effects discussed, recommend completing 7-10 days. For now we will hold on imaging. She has an appt next week.  Pain of right lower extremity - Plan: celecoxib (CELEBREX) 100 MG capsule Chronic. Hx does not suggest dermatomal distribution. ? Radicular pain. For now we will hold on prednisone taper. Monitor for new symptoms. Instructed about warning signs.   I discussed the assessment and treatment plan  with the patient. Mia Robinson was provided an opportunity to ask questions and all were answered.She agreed with the plan and demonstrated an understanding of the instructions.   Return if symptoms worsen or fail to improve, for Keep appt next week..   [name], MD

## 2020-06-27 ENCOUNTER — Other Ambulatory Visit: Payer: Self-pay

## 2020-06-27 ENCOUNTER — Encounter: Payer: Self-pay | Admitting: Family Medicine

## 2020-06-27 ENCOUNTER — Ambulatory Visit (INDEPENDENT_AMBULATORY_CARE_PROVIDER_SITE_OTHER): Payer: BC Managed Care – PPO | Admitting: Family Medicine

## 2020-06-27 VITALS — BP 128/80 | HR 72 | Resp 16 | Ht 65.0 in | Wt 177.2 lb

## 2020-06-27 DIAGNOSIS — Z1159 Encounter for screening for other viral diseases: Secondary | ICD-10-CM

## 2020-06-27 DIAGNOSIS — Z1329 Encounter for screening for other suspected endocrine disorder: Secondary | ICD-10-CM

## 2020-06-27 DIAGNOSIS — Z0000 Encounter for general adult medical examination without abnormal findings: Secondary | ICD-10-CM

## 2020-06-27 DIAGNOSIS — E785 Hyperlipidemia, unspecified: Secondary | ICD-10-CM

## 2020-06-27 DIAGNOSIS — Z130 Encounter for screening for diseases of the blood and blood-forming organs and certain disorders involving the immune mechanism: Secondary | ICD-10-CM

## 2020-06-27 DIAGNOSIS — Z13228 Encounter for screening for other metabolic disorders: Secondary | ICD-10-CM

## 2020-06-27 NOTE — Progress Notes (Signed)
HPI: Mia Robinson is a 49 y.o. female, who is here today for her routine physical.  Last CPE: 01/2018.  Regular exercise 3 or more time per week: Not consistently. Following a healthy diet: Yes, she is cooking at home. She lives alone. She is planning on moving with her daughter and granddaughter..  Chronic medical problems: Uterine fibroids,vit D def,HLD, and HTN among some.  Pap smear: 02/09/2018 Hx of abnormal pap smears: Negative for malignancy and HPV was not detected. M:10 G:3 L:2 A:1 She breast fed.   Immunization History  Administered Date(s) Administered  . Tdap 04/01/2013   Mammogram: 08/22/19. She already has an appt in 08/2020. Colonoscopy: Never. DEXA: N/A  Hep C screening: Never.  We have a virtual visit on 06/22/20, when she was c/o right groin pain. She is reporting great improvement. She is taking Celebrex 100 mg bid, well tolerated.  HLD: She is on non pharmacologic treatment.  Component     Latest Ref Rng & Units 09/12/2019  Cholesterol     <200 mg/dL 189  Triglycerides     <150 mg/dL 76.0  HDL Cholesterol     > OR = 50 mg/dL 57.00  VLDL     0.0 - 40.0 mg/dL 15.2  LDL (calc)     0 - 99 mg/dL 117 (H)  Total CHOL/HDL Ratio     <5.0 (calc) 3  NonHDL      132.47   Review of Systems  Constitutional: Negative for appetite change and fever.  HENT: Negative for dental problem, hearing loss, mouth sores and sore throat.   Eyes: Negative for redness and visual disturbance.  Respiratory: Negative for cough, shortness of breath and wheezing.   Cardiovascular: Negative for chest pain and leg swelling.  Gastrointestinal: Negative for abdominal pain, nausea and vomiting.       No changes in bowel habits.  Endocrine: Negative for cold intolerance, heat intolerance, polydipsia, polyphagia and polyuria.  Genitourinary: Negative for decreased urine volume, dysuria, hematuria, vaginal bleeding and vaginal discharge.  Musculoskeletal:  Negative for gait problem and joint swelling.  Skin: Negative for color change and rash.  Allergic/Immunologic: Negative for environmental allergies.  Neurological: Negative for syncope, weakness and headaches.  Hematological: Negative for adenopathy. Does not bruise/bleed easily.  Psychiatric/Behavioral: Negative for confusion. The patient is nervous/anxious.   All other systems reviewed and are negative.  Current Outpatient Medications on File Prior to Visit  Medication Sig Dispense Refill  . celecoxib (CELEBREX) 100 MG capsule Take 1 capsule (100 mg total) by mouth 2 (two) times daily for 10 days. 20 capsule 0  . Cholecalciferol (D-3-5) 125 MCG (5000 UT) capsule Take 5,000 Units by mouth daily.    . Magnesium Oxide (MAG-200 PO) Take 1 tablet by mouth daily.    . Multiple Vitamin (MULTIVITAMIN) tablet Take 1 tablet by mouth daily.    . nebivolol (BYSTOLIC) 5 MG tablet Take 1 tablet (5 mg total) by mouth daily. 90 tablet 2  . UNABLE TO FIND Bone-Up 3x a day.    . vitamin B-12 (CYANOCOBALAMIN) 1000 MCG tablet Take 1,000 mcg by mouth daily.     No current facility-administered medications on file prior to visit.    Past Medical History:  Diagnosis Date  . Anxiety     Past Surgical History:  Procedure Laterality Date  . BREAST BIOPSY Left 2015  . TUBAL LIGATION      No Known Allergies  Family History  Problem Relation Age of Onset  .  Hypertension Father     Social History   Socioeconomic History  . Marital status: Single    Spouse name: Not on file  . Number of children: Not on file  . Years of education: Not on file  . Highest education level: Not on file  Occupational History  . Not on file  Tobacco Use  . Smoking status: Never Smoker  . Smokeless tobacco: Never Used  Vaping Use  . Vaping Use: Never used  Substance and Sexual Activity  . Alcohol use: No  . Drug use: No  . Sexual activity: Not Currently    Birth control/protection: Surgical    Comment: TUBAL  LIGATION, intercourse age 17 , more than 5 sexual partners  Other Topics Concern  . Not on file  Social History Narrative  . Not on file   Social Determinants of Health   Financial Resource Strain:   . Difficulty of Paying Living Expenses: Not on file  Food Insecurity:   . Worried About Charity fundraiser in the Last Year: Not on file  . Ran Out of Food in the Last Year: Not on file  Transportation Needs:   . Lack of Transportation (Medical): Not on file  . Lack of Transportation (Non-Medical): Not on file  Physical Activity:   . Days of Exercise per Week: Not on file  . Minutes of Exercise per Session: Not on file  Stress:   . Feeling of Stress : Not on file  Social Connections:   . Frequency of Communication with Friends and Family: Not on file  . Frequency of Social Gatherings with Friends and Family: Not on file  . Attends Religious Services: Not on file  . Active Member of Clubs or Organizations: Not on file  . Attends Archivist Meetings: Not on file  . Marital Status: Not on file     Vitals:   06/27/20 0812  BP: 128/80  Pulse: 72  Resp: 16  SpO2: 97%   Body mass index is 29.5 kg/m.  Wt Readings from Last 3 Encounters:  06/27/20 177 lb 4 oz (80.4 kg)  09/12/19 165 lb (74.8 kg)  02/09/18 156 lb (70.8 kg)    Physical Exam Vitals and nursing note reviewed.  Constitutional:      General: She is not in acute distress.    Appearance: She is well-developed.  HENT:     Head: Normocephalic and atraumatic.     Right Ear: Hearing, tympanic membrane, ear canal and external ear normal.     Left Ear: Hearing, tympanic membrane, ear canal and external ear normal.     Mouth/Throat:     Mouth: Mucous membranes are moist.     Pharynx: Oropharynx is clear. Uvula midline.  Eyes:     Extraocular Movements: Extraocular movements intact.     Conjunctiva/sclera: Conjunctivae normal.     Pupils: Pupils are equal, round, and reactive to light.  Neck:      Thyroid: No thyromegaly.     Trachea: No tracheal deviation.  Cardiovascular:     Rate and Rhythm: Normal rate and regular rhythm.     Pulses:          Dorsalis pedis pulses are 2+ on the right side and 2+ on the left side.     Heart sounds: No murmur heard.   Pulmonary:     Effort: Pulmonary effort is normal. No respiratory distress.     Breath sounds: Normal breath sounds.  Abdominal:  Palpations: Abdomen is soft. There is no hepatomegaly or mass.     Tenderness: There is no abdominal tenderness.     Hernia: There is no hernia in the right inguinal area.  Genitourinary:    Comments: Breast: No masses,skin changes, or nipple discharge bilateral. Musculoskeletal:     Comments: No major deformity or signs of synovitis appreciated.  Lymphadenopathy:     Cervical: No cervical adenopathy.     Upper Body:     Right upper body: No supraclavicular or axillary adenopathy.     Left upper body: No supraclavicular or axillary adenopathy.  Skin:    General: Skin is warm.     Findings: No erythema or rash.  Neurological:     General: No focal deficit present.     Mental Status: She is alert and oriented to person, place, and time.     Cranial Nerves: No cranial nerve deficit.     Coordination: Coordination normal.     Gait: Gait normal.     Deep Tendon Reflexes:     Reflex Scores:      Bicep reflexes are 2+ on the right side and 2+ on the left side.      Patellar reflexes are 2+ on the right side and 2+ on the left side. Psychiatric:     Comments: Well groomed, good eye contact.   ASSESSMENT AND PLAN:  Ms. Navreet Bolda was here today annual physical examination.  Orders Placed This Encounter  Procedures  . BASIC METABOLIC PANEL WITH GFR  . Hemoglobin A1c  . Lipid panel  . Hepatitis C antibody   Routine general medical examination at a health care facility We discussed the importance of regular physical activity and healthy diet for prevention of chronic illness  and/or complications. Preventive guidelines reviewed. Vaccination up to date. We discussed new recommendations about colon cancer screening.Recommend calling her insurance to found out above coverage. Next CPE in a year.  The 10-year ASCVD risk score Mikey Bussing DC Brooke Bonito., et al., 2013) is: 1%   Values used to calculate the score:     Age: 60 years     Sex: Female     Is Non-Hispanic African American: No     Diabetic: No     Tobacco smoker: No     Systolic Blood Pressure: 007 mmHg     Is BP treated: Yes     HDL Cholesterol: 65 mg/dL     Total Cholesterol: 208 mg/dL  Hyperlipidemia, unspecified hyperlipidemia type Continue non pharmacologic treatment.  Encounter for HCV screening test for low risk patient -     Hepatitis C antibody  Screening for endocrine, metabolic and immunity disorder -     Hemoglobin A1c -     BASIC METABOLIC PANEL WITH GFR   Return in 6 months (on 12/26/2020).  Feleica Fulmore G. [place], MD  [facility]. [place] office.   Today you have you routine preventive visit. A few things to remember from today's visit:   Encounter for HCV screening test for low risk patient - Plan: Hepatitis C antibody  Routine general medical examination at a health care facility - Plan: Hemoglobin A1c, Lipid panel  Screening for endocrine, metabolic and immunity disorder - Plan: BASIC METABOLIC PANEL WITH GFR, Hemoglobin A1c  Please be sure medication list is accurate. If a new problem present, please set up appointment sooner than planned today.  At least 150 minutes of moderate exercise per week, daily brisk walking for 15-30 min is a good  exercise option. Healthy diet low in saturated (animal) fats and sweets and consisting of fresh fruits and vegetables, lean meats such as fish and white chicken and whole grains.  These are some of recommendations for screening depending of age and risk factors:  - Vaccines:  Tdap vaccine every 10 years.  Shingles vaccine  recommended at age 31, could be given after 47 years of age but not sure about insurance coverage.   Pneumonia vaccines: Pneumovax at 50. Sometimes Pneumovax is giving earlier if history of smoking, lung disease,diabetes,kidney disease among some.  Screening for diabetes at age 16 and every 3 years.  Cervical cancer prevention:  Pap smear starts at 47 years of age and continues periodically until 47 years old in low risk women. Pap smear every 3 years between 14 and 13 years old. Pap smear every 3-5 years between women 11 and older if pap smear negative and HPV screening negative.   -Breast cancer: Mammogram: There is disagreement between experts about when to start screening in low risk asymptomatic female but recent recommendations are to start screening at 71 and not later than 47 years old , every 1-2 years and after 47 yo q 2 years. Screening is recommended until 46 years old but some women can continue screening depending of healthy issues.  Colon cancer screening: Has been recently changed to 47 yo. Insurance may not cover until you are 47 years old. Screening is recommended until 47 years old.  Cholesterol disorder screening at age 28 and every 3 years.N/A  Also recommended:  1. Dental visit- Brush and floss your teeth twice daily; visit your dentist twice a year. 2. Eye doctor- Get an eye exam at least every 2 years. 3. Helmet use- Always wear a helmet when riding a bicycle, motorcycle, rollerblading or skateboarding. 4. Safe sex- If you may be exposed to sexually transmitted infections, use a condom. 5. Seat belts- Seat belts can save your live; always wear one. 6. Smoke/Carbon Monoxide detectors- These detectors need to be installed on the appropriate level of your home. Replace batteries at least once a year. 7. Skin cancer- When out in the sun please cover up and use sunscreen 15 SPF or higher. 8. Violence- If anyone is threatening or hurting you, please tell your healthcare  provider.  9. Drink alcohol in moderation- Limit alcohol intake to one drink or less per day. Never drink and drive. 10. Calcium supplementation 1000 to 1200 mg daily, ideally through your diet.  Vitamin D supplementation 800 units daily.

## 2020-06-27 NOTE — Patient Instructions (Addendum)
Today you have you routine preventive visit. A few things to remember from today's visit:   Encounter for HCV screening test for low risk patient - Plan: Hepatitis C antibody  Routine general medical examination at a health care facility - Plan: Hemoglobin A1c, Lipid panel  Screening for endocrine, metabolic and immunity disorder - Plan: BASIC METABOLIC PANEL WITH GFR, Hemoglobin A1c  Please be sure medication list is accurate. If a new problem present, please set up appointment sooner than planned today.  At least 150 minutes of moderate exercise per week, daily brisk walking for 15-30 min is a good exercise option. Healthy diet low in saturated (animal) fats and sweets and consisting of fresh fruits and vegetables, lean meats such as fish and white chicken and whole grains.  These are some of recommendations for screening depending of age and risk factors:  - Vaccines:  Tdap vaccine every 10 years.  Shingles vaccine recommended at age 56, could be given after 47 years of age but not sure about insurance coverage.   Pneumonia vaccines: Pneumovax at 14. Sometimes Pneumovax is giving earlier if history of smoking, lung disease,diabetes,kidney disease among some.  Screening for diabetes at age 70 and every 3 years.  Cervical cancer prevention:  Pap smear starts at 47 years of age and continues periodically until 47 years old in low risk women. Pap smear every 3 years between 13 and 7 years old. Pap smear every 3-5 years between women 60 and older if pap smear negative and HPV screening negative.   -Breast cancer: Mammogram: There is disagreement between experts about when to start screening in low risk asymptomatic female but recent recommendations are to start screening at 15 and not later than 47 years old , every 1-2 years and after 47 yo q 2 years. Screening is recommended until 47 years old but some women can continue screening depending of healthy issues.  Colon cancer  screening: Has been recently changed to 47 yo. Insurance may not cover until you are 47 years old. Screening is recommended until 47 years old.  Cholesterol disorder screening at age 9 and every 3 years.N/A  Also recommended:  1. Dental visit- Brush and floss your teeth twice daily; visit your dentist twice a year. 2. Eye doctor- Get an eye exam at least every 2 years. 3. Helmet use- Always wear a helmet when riding a bicycle, motorcycle, rollerblading or skateboarding. 4. Safe sex- If you may be exposed to sexually transmitted infections, use a condom. 5. Seat belts- Seat belts can save your live; always wear one. 6. Smoke/Carbon Monoxide detectors- These detectors need to be installed on the appropriate level of your home. Replace batteries at least once a year. 7. Skin cancer- When out in the sun please cover up and use sunscreen 15 SPF or higher. 8. Violence- If anyone is threatening or hurting you, please tell your healthcare provider.  9. Drink alcohol in moderation- Limit alcohol intake to one drink or less per day. Never drink and drive. 10. Calcium supplementation 1000 to 1200 mg daily, ideally through your diet.  Vitamin D supplementation 800 units daily.

## 2020-06-28 LAB — LIPID PANEL
Cholesterol: 208 mg/dL — ABNORMAL HIGH (ref <200)
HDL: 65 mg/dL (ref >=50)
LDL Cholesterol (Calc): 123 mg/dL (calc) — ABNORMAL HIGH
Non-HDL Cholesterol (Calc): 143 mg/dL (calc) — ABNORMAL HIGH (ref <130)
Total CHOL/HDL Ratio: 3.2 (calc) (ref <5.0)
Triglycerides: 95 mg/dL (ref <150)

## 2020-06-28 LAB — BASIC METABOLIC PANEL WITH GFR
BUN/Creatinine Ratio: 20 (calc) (ref 6–22)
BUN: 10 mg/dL (ref 7–25)
CO2: 26 mmol/L (ref 20–32)
Calcium: 9.8 mg/dL (ref 8.6–10.2)
Chloride: 101 mmol/L (ref 98–110)
Creat: 0.49 mg/dL — ABNORMAL LOW (ref 0.50–1.10)
GFR, Est African American: 135 mL/min/{1.73_m2} (ref >=60)
GFR, Est Non African American: 117 mL/min/{1.73_m2} (ref >=60)
Glucose, Bld: 83 mg/dL (ref 65–99)
Potassium: 4.3 mmol/L (ref 3.5–5.3)
Sodium: 140 mmol/L (ref 135–146)

## 2020-06-28 LAB — HEMOGLOBIN A1C
Hgb A1c MFr Bld: 5.6 % of total Hgb (ref <5.7)
Mean Plasma Glucose: 114 (calc)
eAG (mmol/L): 6.3 (calc)

## 2020-06-28 LAB — HEPATITIS C ANTIBODY
Hepatitis C Ab: NONREACTIVE
SIGNAL TO CUT-OFF: 0.02 (ref <1.00)

## 2020-06-29 ENCOUNTER — Other Ambulatory Visit: Payer: Self-pay | Admitting: Family Medicine

## 2020-06-29 DIAGNOSIS — R1031 Right lower quadrant pain: Secondary | ICD-10-CM

## 2020-06-29 DIAGNOSIS — M79604 Pain in right leg: Secondary | ICD-10-CM

## 2020-07-10 ENCOUNTER — Telehealth: Payer: Self-pay

## 2020-07-10 MED ORDER — NEBIVOLOL HCL 5 MG PO TABS: 5.0000 mg | ORAL_TABLET | Freq: Every day | ORAL | 2 refills | Status: DC

## 2020-07-10 NOTE — Telephone Encounter (Signed)
Patient called in needing a refill on her medication    nebivolol (BYSTOLIC) 5 MG tablet   [id number] - , [place] - [address]

## 2020-07-10 NOTE — Telephone Encounter (Signed)
Rx sent in

## 2020-07-12 ENCOUNTER — Other Ambulatory Visit: Payer: Self-pay | Admitting: Family Medicine

## 2020-07-12 DIAGNOSIS — M79604 Pain in right leg: Secondary | ICD-10-CM

## 2020-07-12 DIAGNOSIS — R1031 Right lower quadrant pain: Secondary | ICD-10-CM

## 2020-07-12 NOTE — Telephone Encounter (Signed)
Received refill for:  Celebrex 100 mg LR 07/05/20, #10, 0 rf LOV 06/27/20 FOV  None scheduled.  Please review and advise.   Thanks.  Dm/cma

## 2020-08-22 ENCOUNTER — Ambulatory Visit
Admission: RE | Admit: 2020-08-22 | Discharge: 2020-08-22 | Disposition: A | Discharge status: Home / Self Care | Payer: BC Managed Care – PPO | Source: Ambulatory Visit | Attending: Internal Medicine | Admitting: Internal Medicine

## 2020-08-22 ENCOUNTER — Other Ambulatory Visit: Payer: Self-pay | Admitting: Family Medicine

## 2020-08-22 ENCOUNTER — Other Ambulatory Visit: Payer: Self-pay

## 2020-08-22 DIAGNOSIS — Z1231 Encounter for screening mammogram for malignant neoplasm of breast: Secondary | ICD-10-CM

## 2020-08-28 ENCOUNTER — Other Ambulatory Visit: Payer: Self-pay | Admitting: Family Medicine

## 2020-08-28 DIAGNOSIS — R928 Other abnormal and inconclusive findings on diagnostic imaging of breast: Secondary | ICD-10-CM

## 2020-09-21 ENCOUNTER — Ambulatory Visit
Admission: RE | Admit: 2020-09-21 | Discharge: 2020-09-21 | Disposition: A | Discharge status: Home / Self Care | Payer: BC Managed Care – PPO | Source: Ambulatory Visit | Attending: Family Medicine | Admitting: Family Medicine

## 2020-09-21 ENCOUNTER — Other Ambulatory Visit: Payer: Self-pay

## 2020-09-21 ENCOUNTER — Ambulatory Visit
Admission: RE | Admit: 2020-09-21 | Discharge: 2020-09-21 | Disposition: A | Discharge status: Home / Self Care | Payer: Self-pay | Source: Ambulatory Visit | Attending: Family Medicine | Admitting: Family Medicine

## 2020-09-21 DIAGNOSIS — R928 Other abnormal and inconclusive findings on diagnostic imaging of breast: Secondary | ICD-10-CM

## 2020-09-26 ENCOUNTER — Telehealth: Payer: Self-pay | Admitting: Family Medicine

## 2020-09-26 MED ORDER — NEBIVOLOL HCL 5 MG PO TABS: 5.0000 mg | ORAL_TABLET | Freq: Every day | ORAL | 2 refills | Status: AC

## 2020-09-26 NOTE — Telephone Encounter (Signed)
Pt call and stated she need a refill on  nebivolol (BYSTOLIC) 5 MG tablet sent to  [facility], [address] AT [facility] [address] [place] Phone:  [phone]  Fax:  [phone]

## 2020-09-26 NOTE — Telephone Encounter (Signed)
Rx sent in

## 2021-01-28 IMAGING — MG MM DIGITAL DIAGNOSTIC UNILAT*R* W/ TOMO W/ CAD
6 series · 6 of 18 positions shown · non-contrast
Comparison: Previous exam(s).

CLINICAL DATA: 49-year-old female recalled from screening mammogram
dated 08/22/2020 for a possible right breast mass.

EXAM:
DIGITAL DIAGNOSTIC RIGHT MAMMOGRAM WITH CAD AND TOMOSYNTHESIS
ULTRASOUND RIGHT BREAST
TECHNIQUE: Right digital diagnostic mammography and breast tomosynthesis was
performed. Digital images of the breast were evaluated with
computer-aided detection. Targeted ultrasound examination of the
right breast was performed.

[R CC synth-2D]
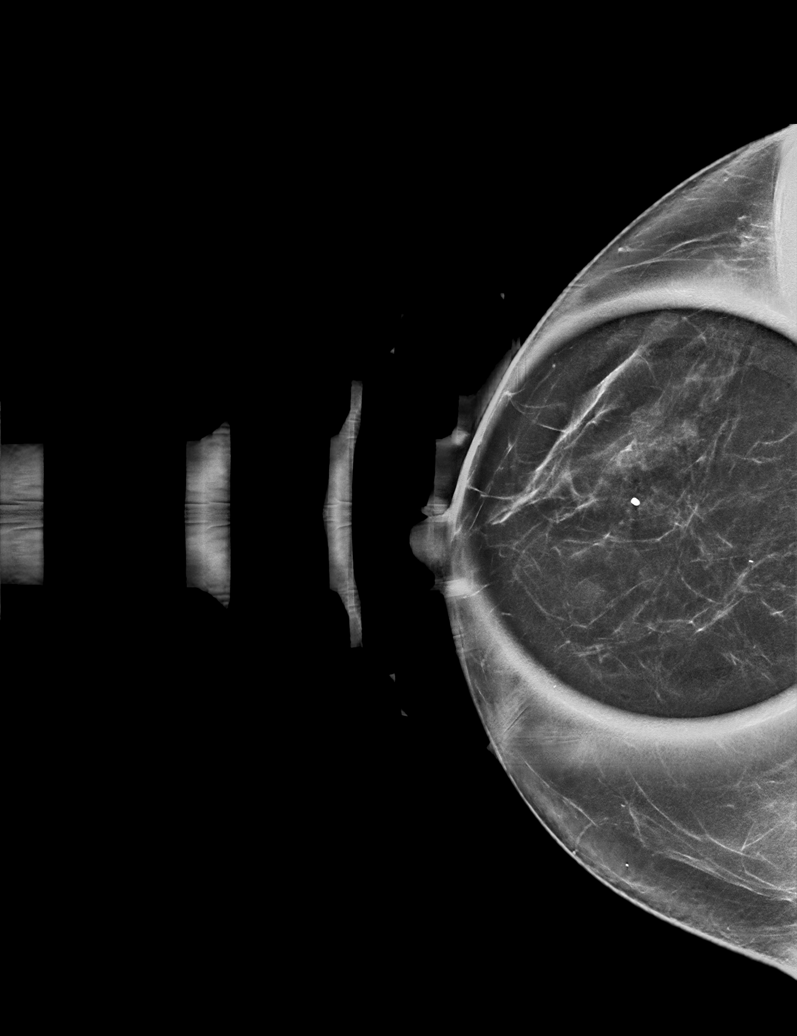

[R MLO synth-2D (1 of 2)]
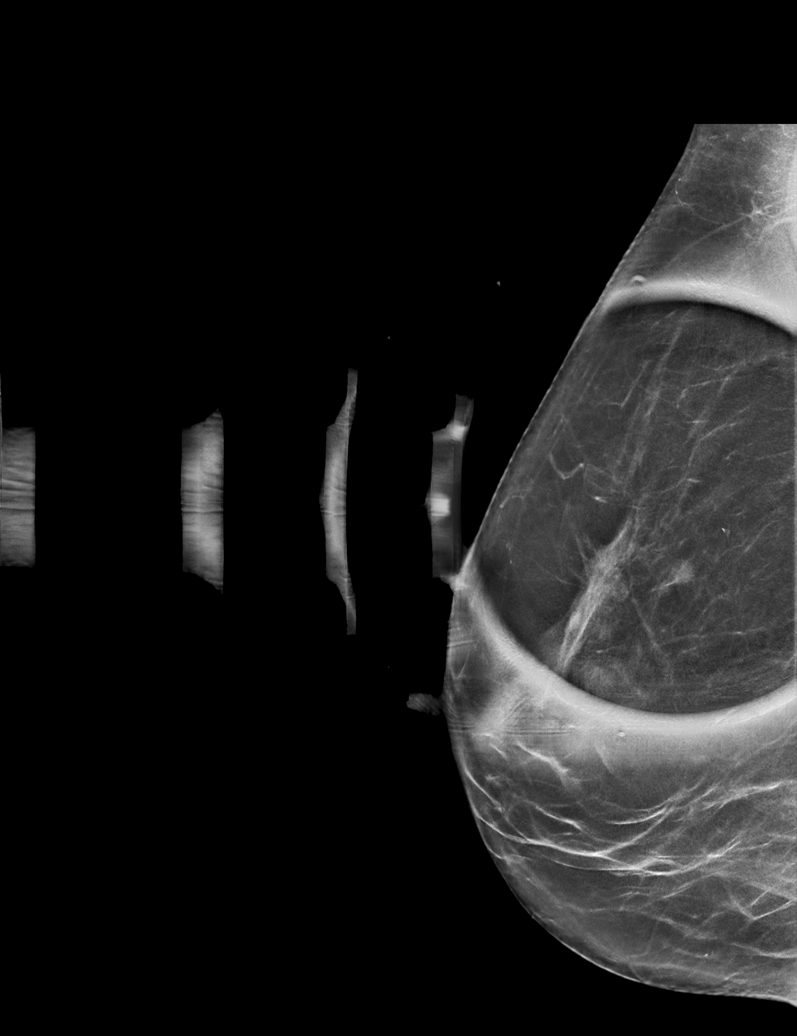

[R MLO synth-2D (2 of 2)]
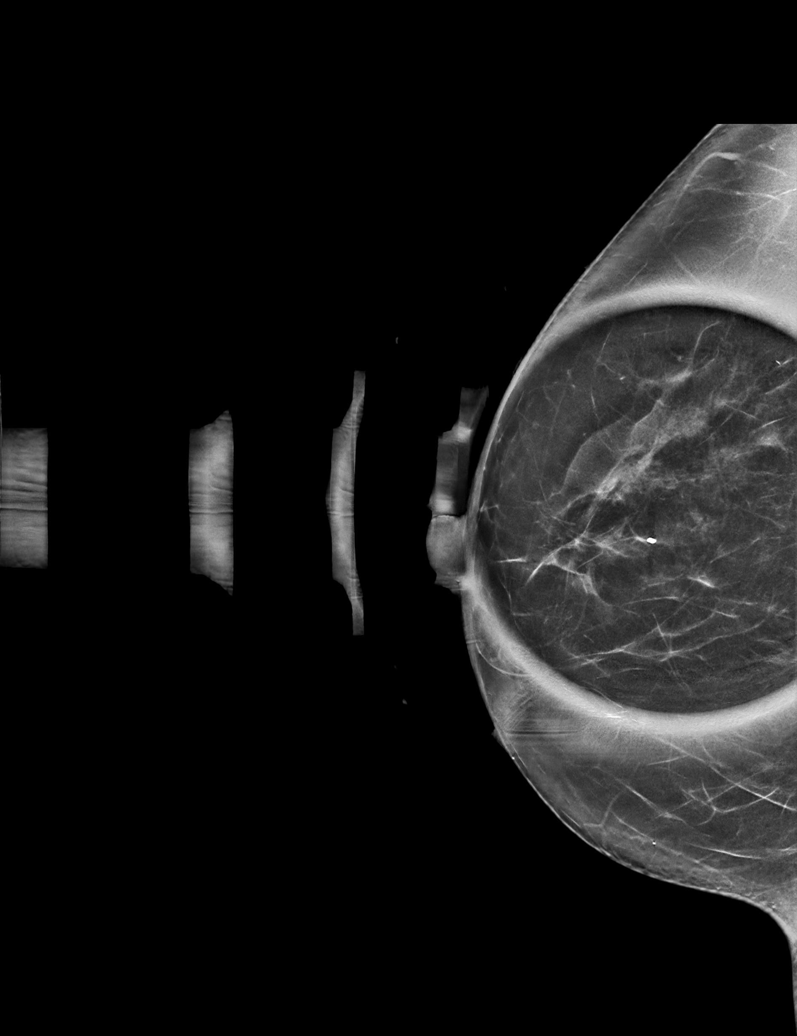

[R MLO tomo (1 of 2) · tomo slice 35/69.0]
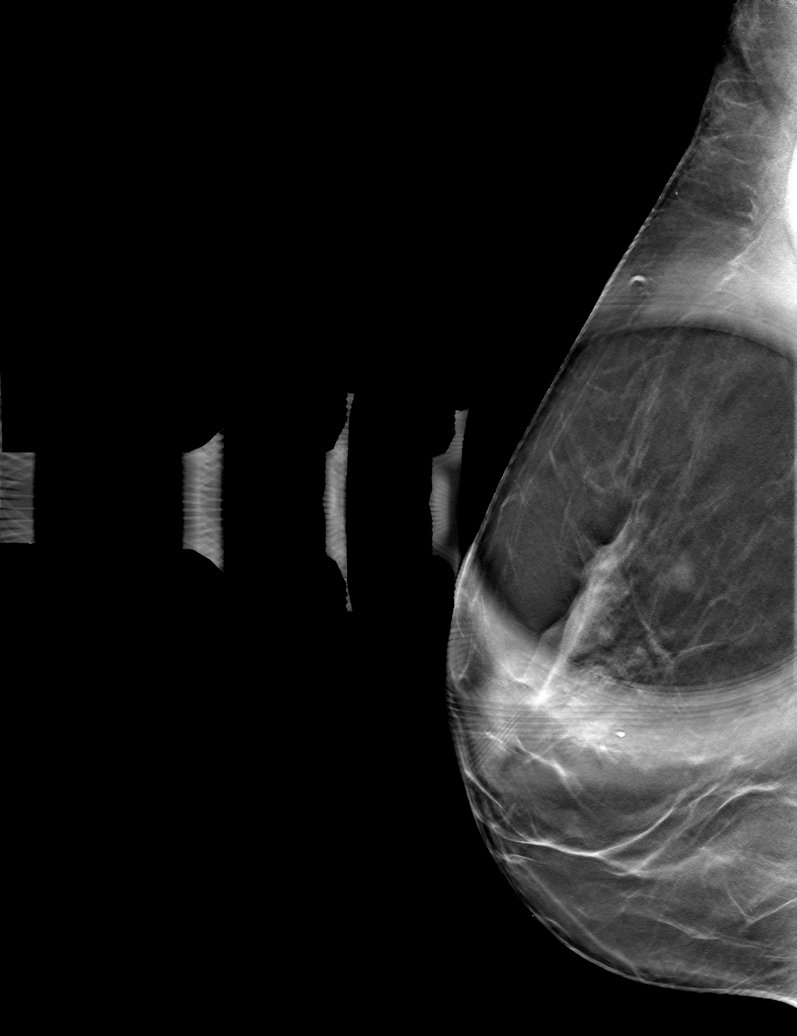

[R CC tomo · tomo slice 33/65.0]
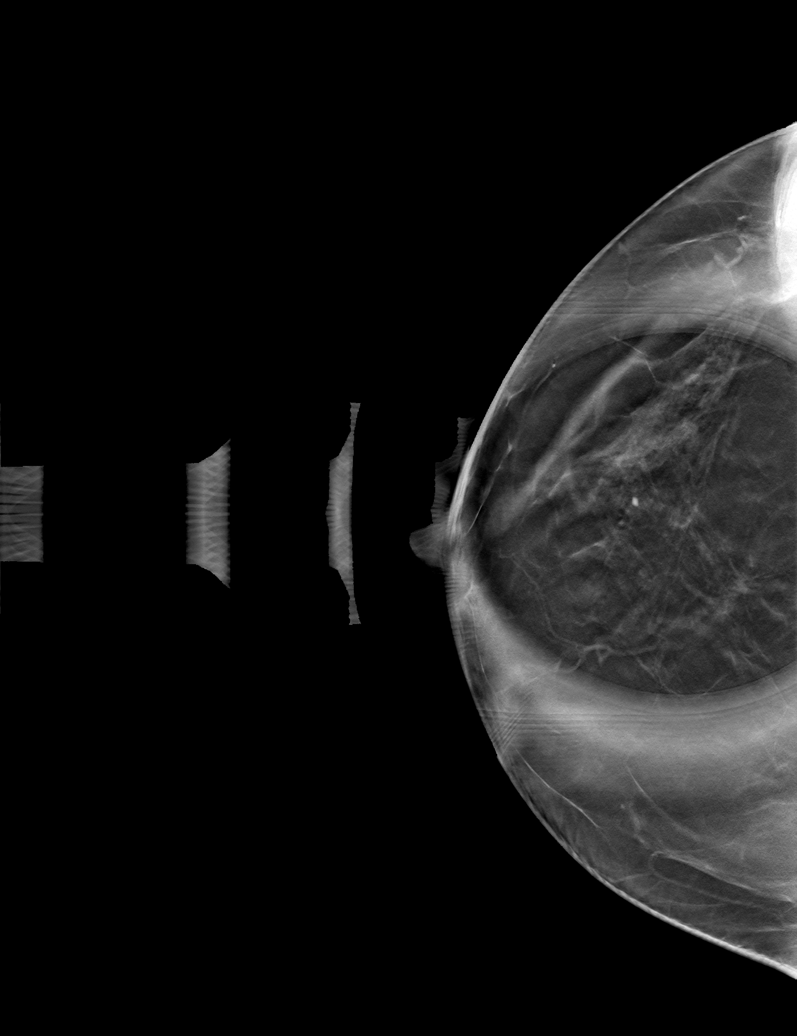

[R MLO tomo (2 of 2) · tomo slice 31/62.0]
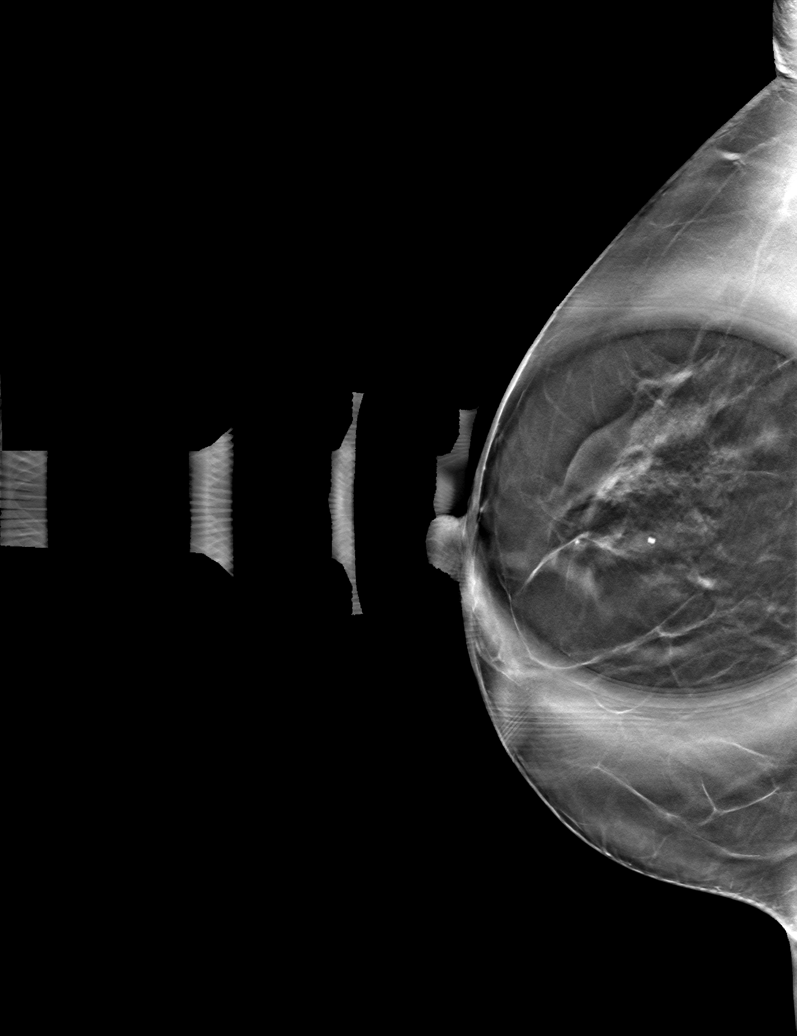

[6 of 18 positions shown; findings below may reference images not displayed]

ACR Breast Density Category b: There are scattered areas of
fibroglandular density.
FINDINGS: There is a persistent oval, circumscribed equal density mass in the
upper outer right breast at middle depth. Further evaluation with
ultrasound was performed.

Targeted ultrasound is performed, showing oval, circumscribed
hypoechoic mass at the 9 o'clock position 3 cm from the nipple. It
measures 7 x 6 x 3 mm. There is peripheral but no internal
vascularity. This may correspond with the mammographic finding.
IMPRESSION: Probably benign right breast mass. Recommendation is for short-term
imaging follow-up.

RECOMMENDATION:
Diagnostic right breast mammogram and ultrasound in 6 months.

I have discussed the findings and recommendations with the patient.
If applicable, a reminder letter will be sent to the patient
regarding the next appointment.

BI-RADS CATEGORY  3: Probably benign.
